# Patient Record
Sex: Female | Born: 1985 | Race: Black or African American | Hispanic: No | Marital: Married | Smoking: Current every day smoker
Health system: Southern US, Community
[De-identification: ages and names within clinical notes are randomized; demographics above are authoritative.]

## PROBLEM LIST (undated history)

## (undated) DIAGNOSIS — K2970 Gastritis, unspecified, without bleeding: Secondary | ICD-10-CM

## (undated) HISTORY — DX: Gastritis, unspecified, without bleeding: K29.70

## (undated) HISTORY — PX: BREAST SURGERY: SHX581

## (undated) HISTORY — PX: LAPAROSCOPY: SHX197

---

## 2009-11-10 DIAGNOSIS — N809 Endometriosis, unspecified: Secondary | ICD-10-CM

## 2009-11-10 HISTORY — PX: ABDOMINAL HYSTERECTOMY: SHX81

## 2009-11-10 HISTORY — DX: Endometriosis, unspecified: N80.9

## 2018-06-11 ENCOUNTER — Encounter (HOSPITAL_COMMUNITY): Payer: Self-pay | Admitting: Emergency Medicine

## 2018-06-11 ENCOUNTER — Emergency Department (HOSPITAL_COMMUNITY)
Admission: EM | Admit: 2018-06-11 | Discharge: 2018-06-11 | Disposition: A | Discharge status: Home / Self Care | Payer: Medicaid Other | Attending: Emergency Medicine | Admitting: Emergency Medicine

## 2018-06-11 DIAGNOSIS — K0889 Other specified disorders of teeth and supporting structures: Secondary | ICD-10-CM | POA: Insufficient documentation

## 2018-06-11 MED ORDER — CLINDAMYCIN HCL 150 MG PO CAPS: 300.0000 mg | ORAL_CAPSULE | Freq: Three times a day (TID) | ORAL | 0 refills | Status: DC

## 2018-06-11 NOTE — ED Provider Notes (Signed)
[facility]-EMERGENCY DEPT Provider Note   CSN: [id number] Arrival date & time: 06/11/18  0955     History   Chief Complaint Chief Complaint  Patient presents with  . Abscess    HPI Yvette Morales is a 32 y.o. female.  HPI   Patient is a 32 year old female no significant past medical history presents emergency department today for evaluation of left upper dental pain that has been ongoing intermittently for the last 4 months.  Over the last 3 to 4 days she feels like her pain has been constant and worsening.  Reports that one week ago she had fevers and drainage from the area but has had no continued fevers or drainage.  Denies any other symptoms.  States she has been trying to see a dentist but she has been able to do so.  She has tried salt water gargles at home which have improved her symptoms.  Over-the-counter medications do not help her symptoms.   History reviewed. No pertinent past medical history.  There are no active problems to display for this patient.   History reviewed. No pertinent surgical history.   OB History   None      Home Medications    Prior to Admission medications   Medication Sig Start Date End Date Taking? Authorizing Provider  clindamycin (CLEOCIN) 150 MG capsule Take 2 capsules (300 mg total) by mouth 3 (three) times daily for 10 days. 06/11/18 06/21/18  Shantel Helwig S, PA-C    Family History No family history on file.  Social History Social History   Tobacco Use  . Smoking status: Never Smoker  . Smokeless tobacco: Never Used  Substance Use Topics  . Alcohol use: Not on file  . Drug use: Not on file     Allergies   Morphine and related; Penicillins; and Codeine   Review of Systems Review of Systems  Constitutional: Negative for fever.  HENT: Positive for dental problem.   Eyes: Negative for visual disturbance.  Respiratory: Negative for shortness of breath.   Cardiovascular: Negative for chest pain.   Gastrointestinal: Negative for abdominal pain.  Genitourinary: Negative for pelvic pain.  Musculoskeletal: Negative for back pain.  Neurological: Negative for headaches.     Physical Exam Updated Vital Signs BP 134/70 (BP Location: Right Arm)   Pulse 74   Temp 98 F (36.7 C) (Oral)   Resp 18   SpO2 100%   Physical Exam  Constitutional: She is oriented to person, place, and time. She appears well-developed and well-nourished. No distress.  HENT:  Head: Normocephalic and atraumatic.  Bilateral TMs normal.  No pharyngeal erythema.  Multiple missing teeth.  No gross abscess on exam.  No tonsillar swelling or exudates.  Uvula midline.  Tolerating secretions.  Mild tenderness to the left maxillary sinus with some swelling.  No trismus on exam.  Eyes: Pupils are equal, round, and reactive to light. Conjunctivae and EOM are normal.  Neck: Neck supple.  Cardiovascular: Normal rate and regular rhythm.  Pulmonary/Chest: Effort normal and breath sounds normal.  Musculoskeletal: Normal range of motion.  Lymphadenopathy:    She has cervical adenopathy.  Neurological: She is alert and oriented to person, place, and time. No cranial nerve deficit.  Skin: Skin is warm and dry.  Psychiatric: She has a normal mood and affect.  Nursing note and vitals reviewed.  ED Treatments / Results  Labs (all labs ordered are listed, but only abnormal results are displayed) Labs Reviewed - No  data to display  EKG None  Radiology No results found.  Procedures Procedures (including critical care time)  Medications Ordered in ED Medications - No data to display   Initial Impression / Assessment and Plan / ED Course  I have reviewed the triage vital signs and the nursing notes.  Pertinent labs & imaging results that were available during my care of the patient were reviewed by me and considered in my medical decision making (see chart for details).   Final Clinical Impressions(s) / ED Diagnoses    Final diagnoses:  Pain, dental   Patient with toothache.  No gross abscess.  Exam unconcerning for Ludwig's angina or spread of infection.  Will treat with antibiotics and pain medicine.  Urged patient to follow-up with dentist.  Return precautions discussed.  Patient was in understanding of plan reasons to return.  All questions answered.  ED Discharge Orders        Ordered    clindamycin (CLEOCIN) 150 MG capsule  3 times daily     06/11/18 1115       Teagen Bucio S, PA-C 06/11/18 1207    Cathren LaineSteinl, Kevin, MD 06/15/18 [phone]

## 2018-06-11 NOTE — Discharge Instructions (Signed)
You were given a prescription for antibiotics. Please take the antibiotic prescription fully.   Please follow up with your primary care provider within 5-7 days for re-evaluation of your symptoms. If you do not have a primary care provider, information for a healthcare clinic has been provided for you to make arrangements for follow up care. Please return to the emergency department for any new or worsening symptoms.  Follow-up with a dentist in regards to your symptoms today.  Dental resources provided.

## 2018-06-11 NOTE — ED Triage Notes (Signed)
Patient here from home with complaints of dental abscess.

## 2018-06-20 DIAGNOSIS — G501 Atypical facial pain: Secondary | ICD-10-CM | POA: Diagnosis present

## 2018-06-20 DIAGNOSIS — K047 Periapical abscess without sinus: Secondary | ICD-10-CM | POA: Diagnosis not present

## 2018-06-21 ENCOUNTER — Emergency Department (HOSPITAL_COMMUNITY)
Admission: EM | Admit: 2018-06-21 | Discharge: 2018-06-21 | Disposition: A | Discharge status: Home / Self Care | Payer: Medicaid Other | Attending: Emergency Medicine | Admitting: Emergency Medicine

## 2018-06-21 ENCOUNTER — Encounter (HOSPITAL_COMMUNITY): Payer: Self-pay | Admitting: Nurse Practitioner

## 2018-06-21 DIAGNOSIS — K047 Periapical abscess without sinus: Secondary | ICD-10-CM

## 2018-06-21 MED ORDER — NAPROXEN 500 MG PO TABS
500.0000 mg | ORAL_TABLET | Freq: Once | ORAL | Status: AC
  Administered 2018-06-21: 500 mg via ORAL
  Filled 2018-06-21: qty 1

## 2018-06-21 MED ORDER — NAPROXEN 500 MG PO TABS: 500.0000 mg | ORAL_TABLET | Freq: Two times a day (BID) | ORAL | 0 refills | Status: DC

## 2018-06-21 MED ORDER — LIDOCAINE VISCOUS HCL 2 % MT SOLN
15.0000 mL | Freq: Once | OROMUCOSAL | Status: AC
  Administered 2018-06-21: 15 mL via OROMUCOSAL
  Filled 2018-06-21: qty 15

## 2018-06-21 MED ORDER — LIDOCAINE VISCOUS HCL 2 % MT SOLN: 15.0000 mL | OROMUCOSAL | 0 refills | Status: DC | PRN

## 2018-06-21 MED ORDER — CLINDAMYCIN HCL 150 MG PO CAPS: 300.0000 mg | ORAL_CAPSULE | Freq: Three times a day (TID) | ORAL | 0 refills | Status: DC

## 2018-06-21 NOTE — ED Provider Notes (Signed)
[facility]-EMERGENCY DEPT Provider Note   CSN: [id number] Arrival date & time: 06/20/18  2356     History   Chief Complaint Chief Complaint  Patient presents with  . Facial Pain    HPI Yvette Morales is a 32 y.o. female.  The history is provided by the patient and medical records.     32 year old female presenting to the ED for left-sided facial pain.  States she was seen in the ED last week for dental abscess.  States he has been taking the clindamycin as directed but pain continues worsening.  She does report she had a dental extraction a few years ago and pieces of the tooth were left embedded in her gums.  States she feels like they are "moving around".  She moved to [place] 6 months ago but has not yet been able to establish care with a dentist as she had to renew her Medicaid.  States she has been taking the clindamycin she was prescribed as well as Tylenol and Orajel without much relief.  History reviewed. No pertinent past medical history.  There are no active problems to display for this patient.   History reviewed. No pertinent surgical history.   OB History   None      Home Medications    Prior to Admission medications   Medication Sig Start Date End Date Taking? Authorizing Provider  clindamycin (CLEOCIN) 150 MG capsule Take 2 capsules (300 mg total) by mouth 3 (three) times daily for 10 days. 06/11/18 06/21/18  Couture, Cortni S, PA-C    Family History History reviewed. No pertinent family history.  Social History Social History   Tobacco Use  . Smoking status: Never Smoker  . Smokeless tobacco: Never Used  Substance Use Topics  . Alcohol use: Yes  . Drug use: Yes     Allergies   Morphine and related; Penicillins; and Codeine   Review of Systems Review of Systems  HENT: Positive for dental problem.   All other systems reviewed and are negative.    Physical Exam Updated Vital Signs BP 128/84 (BP Location: Left  Arm)   Pulse 86   Temp 98.6 F (37 C) (Oral)   Resp 16   Ht 5\' 5"  (1.651 m)   Wt 52.2 kg   SpO2 100%   BMI 19.14 kg/m   Physical Exam  Constitutional: She is oriented to person, place, and time. She appears well-developed and well-nourished.  HENT:  Head: Normocephalic and atraumatic.  Mouth/Throat: Oropharynx is clear and moist.  Teeth largely in ppor dentition, multiple teeth are missing, left lateral canine in place but appears infected; surrounding gingiva discolored and mildly swollen, handling secretions appropriately, no trismus, no facial or neck swelling, normal phonation without stridor  Eyes: Pupils are equal, round, and reactive to light. Conjunctivae and EOM are normal.  Neck: Normal range of motion.  Cardiovascular: Normal rate, regular rhythm and normal heart sounds.  Pulmonary/Chest: Effort normal and breath sounds normal. No stridor. No respiratory distress.  Abdominal: Soft. Bowel sounds are normal. There is no tenderness. There is no rebound.  Musculoskeletal: Normal range of motion.  Neurological: She is alert and oriented to person, place, and time.  Skin: Skin is warm and dry.  Psychiatric: She has a normal mood and affect.  Nursing note and vitals reviewed.    ED Treatments / Results  Labs (all labs ordered are listed, but only abnormal results are displayed) Labs Reviewed - No data to display  EKG None  Radiology No results found.  Procedures Procedures (including critical care time)  Medications Ordered in ED Medications - No data to display   Initial Impression / Assessment and Plan / ED Course  I have reviewed the triage vital signs and the nursing notes.  Pertinent labs & imaging results that were available during my care of the patient were reviewed by me and considered in my medical decision making (see chart for details).  32 year old female here with left-sided facial pain.  Seen here last week for dental abscess.  On exam she is  afebrile and nontoxic.  Does appear to have dental infection but no discrete abscess visualized on my exam.  Her main complaint is worsening pain.  She has no significant facial or neck swelling.  Normal phonation without stridor.  No signs or symptoms concerning for Ludwig's angina or deep space infection of the neck.  Will continue her clindamycin, add naprosyn and viscous lidocaine.  Will refer to dentist on call, strongly encouraged to follow-up ASAP.  Discussed plan with patient, she acknowledged understanding and agreed with plan of care.  Return precautions given for new or worsening symptoms.  Final Clinical Impressions(s) / ED Diagnoses   Final diagnoses:  Dental infection    ED Discharge Orders         Ordered    clindamycin (CLEOCIN) 150 MG capsule  3 times daily     06/21/18 0114    naproxen (NAPROSYN) 500 MG tablet  2 times daily with meals     06/21/18 0114    lidocaine (XYLOCAINE) 2 % solution  Every 2 hours PRN     06/21/18 0114           Garlon HatchetSanders, Shenell Rogalski M, PA-C 06/21/18 0123    Molpus, Jonny RuizJohn, MD 06/21/18 [phone]

## 2018-06-21 NOTE — Discharge Instructions (Signed)
Take the prescribed medication as directed. Follow-up with Dr. Michiel SitesKoelling-- call his office first thing in the morning to see if they can fit you in. Return to the ED for new or worsening symptoms.

## 2018-06-21 NOTE — ED Triage Notes (Signed)
Pt is c/o left sided severe facial pain that she describes as "something about to pop in my head, in eye or nose." She relates the pain to dental abscess that she is currently taking abx as prescribed but states the pain is unbearable ans the swelling is only worsening.

## 2018-12-05 ENCOUNTER — Emergency Department: Payer: Medicaid Other

## 2018-12-05 ENCOUNTER — Other Ambulatory Visit: Payer: Self-pay

## 2018-12-05 ENCOUNTER — Encounter: Payer: Self-pay | Admitting: Emergency Medicine

## 2018-12-05 ENCOUNTER — Emergency Department
Admission: EM | Admit: 2018-12-05 | Discharge: 2018-12-05 | Disposition: A | Discharge status: Home / Self Care | Payer: Medicaid Other | Attending: Emergency Medicine | Admitting: Emergency Medicine

## 2018-12-05 DIAGNOSIS — N3001 Acute cystitis with hematuria: Secondary | ICD-10-CM | POA: Insufficient documentation

## 2018-12-05 DIAGNOSIS — R1030 Lower abdominal pain, unspecified: Secondary | ICD-10-CM | POA: Diagnosis not present

## 2018-12-05 LAB — URINALYSIS, COMPLETE (UACMP) WITH MICROSCOPIC
Bilirubin Urine: NEGATIVE
Glucose, UA: NEGATIVE mg/dL
Ketones, ur: NEGATIVE mg/dL
Nitrite: POSITIVE — AB
PROTEIN: 30 mg/dL — AB
SPECIFIC GRAVITY, URINE: 1.016 (ref 1.005–1.030)
WBC, UA: >50 WBC/hpf — ABNORMAL HIGH (ref 0–5)
pH: 5 (ref 5.0–8.0)

## 2018-12-05 LAB — CBC WITH DIFFERENTIAL/PLATELET
Abs Immature Granulocytes: 0.02 10*3/uL (ref 0.00–0.07)
Basophils Absolute: 0 10*3/uL (ref 0.0–0.1)
Basophils Relative: 1 %
EOS PCT: 3 %
Eosinophils Absolute: 0.3 10*3/uL (ref 0.0–0.5)
HEMATOCRIT: 42.8 % (ref 36.0–46.0)
HEMOGLOBIN: 13.8 g/dL (ref 12.0–15.0)
IMMATURE GRANULOCYTES: 0 %
LYMPHS ABS: 3.1 10*3/uL (ref 0.7–4.0)
LYMPHS PCT: 35 %
MCH: 28.2 pg (ref 26.0–34.0)
MCHC: 32.2 g/dL (ref 30.0–36.0)
MCV: 87.3 fL (ref 80.0–100.0)
Monocytes Absolute: 0.7 10*3/uL (ref 0.1–1.0)
Monocytes Relative: 8 %
NEUTROS PCT: 53 %
NRBC: 0 % (ref 0.0–0.2)
Neutro Abs: 4.7 10*3/uL (ref 1.7–7.7)
Platelets: 256 10*3/uL (ref 150–400)
RBC: 4.9 MIL/uL (ref 3.87–5.11)
RDW: 13.6 % (ref 11.5–15.5)
WBC: 8.9 10*3/uL (ref 4.0–10.5)

## 2018-12-05 LAB — COMPREHENSIVE METABOLIC PANEL
ALBUMIN: 4.6 g/dL (ref 3.5–5.0)
ALK PHOS: 47 U/L (ref 38–126)
ALT: 14 U/L (ref 0–44)
AST: 16 U/L (ref 15–41)
Anion gap: 8 (ref 5–15)
BILIRUBIN TOTAL: 0.7 mg/dL (ref 0.3–1.2)
BUN: 13 mg/dL (ref 6–20)
CHLORIDE: 104 mmol/L (ref 98–111)
CO2: 27 mmol/L (ref 22–32)
Calcium: 9.7 mg/dL (ref 8.9–10.3)
Creatinine, Ser: 0.74 mg/dL (ref 0.44–1.00)
GFR calc Af Amer: >60 mL/min (ref >60)
GFR calc non Af Amer: >60 mL/min (ref >60)
GLUCOSE: 82 mg/dL (ref 70–99)
Potassium: 4.2 mmol/L (ref 3.5–5.1)
Sodium: 139 mmol/L (ref 135–145)
TOTAL PROTEIN: 7.6 g/dL (ref 6.5–8.1)

## 2018-12-05 LAB — LIPASE, BLOOD: Lipase: 26 U/L (ref 11–51)

## 2018-12-05 MED ORDER — PHENAZOPYRIDINE HCL 200 MG PO TABS: 200.0000 mg | ORAL_TABLET | Freq: Three times a day (TID) | ORAL | 0 refills | Status: DC | PRN

## 2018-12-05 MED ORDER — HYDROMORPHONE HCL 1 MG/ML IJ SOLN
0.5000 mg | Freq: Once | INTRAMUSCULAR | Status: AC
  Administered 2018-12-05: 0.5 mg via INTRAVENOUS
  Filled 2018-12-05: qty 1

## 2018-12-05 MED ORDER — SULFAMETHOXAZOLE-TRIMETHOPRIM 800-160 MG PO TABS: 1.0000 | ORAL_TABLET | Freq: Two times a day (BID) | ORAL | 0 refills | Status: DC

## 2018-12-05 MED ORDER — PHENAZOPYRIDINE HCL 200 MG PO TABS
200.0000 mg | ORAL_TABLET | Freq: Three times a day (TID) | ORAL | 0 refills | Status: AC | PRN
Start: 2018-12-05 — End: 2019-12-05

## 2018-12-05 MED ORDER — SODIUM CHLORIDE 0.9 % IV SOLN
1.0000 g | Freq: Once | INTRAVENOUS | Status: AC
  Administered 2018-12-05: 1 g via INTRAVENOUS
  Filled 2018-12-05 (×2): qty 10

## 2018-12-05 NOTE — ED Provider Notes (Signed)
[facility] Emergency Department Provider Note       Time seen: ----------------------------------------- 3:00 PM on 12/05/2018 -----------------------------------------   I have reviewed the triage vital signs and the nursing notes.  HISTORY   Chief Complaint No chief complaint on file.    HPI Yvette Morales is a 33 y.o. female with no significant past medical history who presents to the ED for abdominal pain.  Patient states severe bladder pressure for the past 4 days, feels like she is not emptying her bladder.  She stopped eating meat recently and has felt fatigued.  She has been taking Azo for the pain, also having pain in the right side.  No past medical history on file.  There are no active problems to display for this patient.   No past surgical history on file.  Allergies Morphine and related; Penicillins; and Codeine  Social History Social History   Tobacco Use  . Smoking status: Never Smoker  . Smokeless tobacco: Never Used  Substance Use Topics  . Alcohol use: Yes  . Drug use: Yes    Review of Systems Constitutional: Negative for fever. Cardiovascular: Negative for chest pain. Respiratory: Negative for shortness of breath. Gastrointestinal: Positive for abdominal pain Genitourinary: Positive for dysuria and urinary hesitancy Musculoskeletal: Positive for flank pain Skin: Negative for rash. Neurological: Negative for headaches, focal weakness or numbness.  All systems negative/normal/unremarkable except as stated in the HPI  ____________________________________________   PHYSICAL EXAM:  VITAL SIGNS: ED Triage Vitals  Enc Vitals Group     BP      Pulse      Resp      Temp      Temp src      SpO2      Weight      Height      Head Circumference      Peak Flow      Pain Score      Pain Loc      Pain Edu?      Excl. in GC?    Constitutional: Alert and oriented.  Mild distress from pain Eyes: Conjunctivae  are normal. Normal extraocular movements. ENT      Head: Normocephalic and atraumatic.      Nose: No congestion/rhinnorhea.      Mouth/Throat: Mucous membranes are moist.      Neck: No stridor. Cardiovascular: Normal rate, regular rhythm. No murmurs, rubs, or gallops. Respiratory: Normal respiratory effort without tachypnea nor retractions. Breath sounds are clear and equal bilaterally. No wheezes/rales/rhonchi. Gastrointestinal: Diffuse lower abdominal tenderness and pain.  No rebound or guarding.  Normal bowel sounds. Musculoskeletal: Nontender with normal range of motion in extremities. No lower extremity tenderness nor edema. Neurologic:  Normal speech and language. No gross focal neurologic deficits are appreciated.  Skin:  Skin is warm, dry and intact. No rash noted. Psychiatric: Mood and affect are normal. Speech and behavior are normal.  ____________________________________________  ED COURSE:  As part of my medical decision making, I reviewed the following data within the electronic MEDICAL RECORD NUMBER History obtained from family if available, nursing notes, old chart and ekg, as well as notes from prior ED visits. Patient presented for lower abdominal pain, we will assess with labs and imaging as indicated at this time.   Procedures ____________________________________________   LABS (pertinent positives/negatives)  Labs Reviewed  URINALYSIS, COMPLETE (UACMP) WITH MICROSCOPIC - Abnormal; Notable for the following components:      Result Value   Color,  Urine AMBER (*)    APPearance CLOUDY (*)    Hgb urine dipstick MODERATE (*)    Protein, ur 30 (*)    Nitrite POSITIVE (*)    Leukocytes, UA LARGE (*)    WBC, UA >50 (*)    Bacteria, UA FEW (*)    All other components within normal limits  CBC WITH DIFFERENTIAL/PLATELET  COMPREHENSIVE METABOLIC PANEL  LIPASE, BLOOD   RADIOLOGY Images were viewed by me IMPRESSION: Tiny nonobstructing renal calculi are noted  bilaterally.  Bilateral renal cysts.  No other focal abnormality is noted. ____________________________________________   DIFFERENTIAL DIAGNOSIS   UTI, ovarian cyst, ovarian torsion, renal colic, appendicitis  FINAL ASSESSMENT AND PLAN  Abdominal pain, UTI  Plan: The patient had presented for acute lower abdominal pain. Patient's labs did reveal significant urinary tract infection. Patient's imaging was negative for any acute process.  She was given fluids and Rocephin here.  She be discharged with Septra DS and Pyridium.  She is cleared for outpatient follow-up.   Ulice Dash, MD    Note: This note was generated in part or whole with voice recognition software. Voice recognition is usually quite accurate but there are transcription errors that can and very often do occur. I apologize for any typographical errors that were not detected and corrected.     Emily Filbert, MD 12/05/18 [phone]

## 2018-12-05 NOTE — ED Triage Notes (Signed)
Here for lower abdominal pain.  Pt has severe bladder pressure for 4 days.  Feels like not emptying bladder. Stopped eating meat recently. Has had fatigue since then.  Has been taking azo. Has seen blood in urine before taking azo.  Has had some right flank pain. Pain in both groin.

## 2018-12-05 NOTE — ED Notes (Signed)
First Nurse Note: Pt to ED c/o possible kidney infection. Pt in NAD

## 2019-06-30 ENCOUNTER — Ambulatory Visit: Payer: Medicaid Other | Admitting: Gastroenterology

## 2020-01-23 ENCOUNTER — Ambulatory Visit (INDEPENDENT_AMBULATORY_CARE_PROVIDER_SITE_OTHER): Payer: Medicaid Other | Admitting: Nurse Practitioner

## 2020-02-21 ENCOUNTER — Ambulatory Visit (INDEPENDENT_AMBULATORY_CARE_PROVIDER_SITE_OTHER): Payer: Medicaid Other | Admitting: Nurse Practitioner

## 2020-02-21 ENCOUNTER — Encounter (INDEPENDENT_AMBULATORY_CARE_PROVIDER_SITE_OTHER): Payer: Self-pay

## 2020-02-21 ENCOUNTER — Encounter (INDEPENDENT_AMBULATORY_CARE_PROVIDER_SITE_OTHER): Payer: Self-pay | Admitting: Nurse Practitioner

## 2020-02-21 ENCOUNTER — Other Ambulatory Visit: Payer: Self-pay

## 2020-02-21 VITALS — BP 120/75 | HR 84 | Temp 98.3°F | Ht 65.0 in | Wt 113.8 lb

## 2020-02-21 DIAGNOSIS — Z1329 Encounter for screening for other suspected endocrine disorder: Secondary | ICD-10-CM | POA: Diagnosis not present

## 2020-02-21 DIAGNOSIS — Z139 Encounter for screening, unspecified: Secondary | ICD-10-CM

## 2020-02-21 DIAGNOSIS — R634 Abnormal weight loss: Secondary | ICD-10-CM

## 2020-02-21 DIAGNOSIS — Z13220 Encounter for screening for lipoid disorders: Secondary | ICD-10-CM | POA: Diagnosis not present

## 2020-02-21 DIAGNOSIS — R6889 Other general symptoms and signs: Secondary | ICD-10-CM

## 2020-02-21 DIAGNOSIS — R5383 Other fatigue: Secondary | ICD-10-CM

## 2020-02-21 DIAGNOSIS — R1030 Lower abdominal pain, unspecified: Secondary | ICD-10-CM | POA: Diagnosis not present

## 2020-02-21 DIAGNOSIS — Z131 Encounter for screening for diabetes mellitus: Secondary | ICD-10-CM | POA: Diagnosis not present

## 2020-02-21 NOTE — Progress Notes (Signed)
Subjective:  Patient ID: Yvette Morales, female    DOB: 12-26-1985  Age: 34 y.o. MRN: [id number]  CC:  Chief Complaint  Patient presents with  . Establish Care  . Abdominal Pain    due to reaction to hydrocodine 5/325 4 yrs ago       HPI  This patient arrives today to establish care at our practice.  Her main concern is that she has been experiencing constant abdominal pain, and she would like work-up for this.  She tells me approximately 5 years ago she had surgery on her breast and was prescribed hydrocodone to take as needed during her recovery.  At that time she experienced an allergic reaction and describes to me blisters that formed on her mouth as well as her rash.  Ever since then she was having intermittent pain of her abdomen.  Over the last 5 years the pain has gotten progressively worse, and she now tells me that it is constant.  She has been evaluated for this and is often told that she has gastritis.  She tells me she has never undergone any further diagnostic testing.  She does me this is mostly been because she is been afraid to undergo procedures and has been caring for her mother who recently passed from liver failure.  She is seeking care now, because she feels she is at a point in her life that she can focus on her own health.  She tells me she did have an episode of rectal bleeding a few weeks ago, but this has not returned.  She also tells me that her stool appears to be light in color and is described as loose.  She tells me she has bowel urgency, and most foods or liquids seem to upset her stomach.  Mangoes and Ensure shakes are things that she can tolerate.  She tells me she has been having some weight loss, but recently has noticed her weight is starting to increase again.   Past Medical History:  Diagnosis Date  . Endometriosis 11/10/2009  . Gastritis       Family History  Problem Relation Age of Onset  . Liver disease Mother   . Cancer Father      Social History   Social History Narrative  . Not on file   Social History   Tobacco Use  . Smoking status: Former Smoker    Types: Cigarettes  . Smokeless tobacco: Former Network engineer Use Topics  . Alcohol use: Not Currently     Current Meds  Medication Sig  . ibuprofen (ADVIL) 200 MG tablet Take 400 mg by mouth every 6 (six) hours as needed.    ROS:  Review of Systems  Constitutional: Positive for malaise/fatigue. Negative for fever and weight loss.  Respiratory: Negative for shortness of breath.   Cardiovascular: Negative for chest pain and palpitations.  Gastrointestinal: Positive for abdominal pain, blood in stool, diarrhea, nausea and vomiting.  Musculoskeletal: Positive for neck pain.  Neurological: Negative for dizziness and headaches.     Objective:   Today's Vitals: BP 120/75 (BP Location: Left Arm, Patient Position: Sitting, Cuff Size: Normal)   Pulse 84   Temp 98.3 F (36.8 C) (Temporal)   Ht '5\' 5"'  (1.651 m)   Wt 113 lb 12.8 oz (51.6 kg)   SpO2 93%   BMI 18.94 kg/m  Vitals with BMI 02/21/2020 12/05/2018 12/05/2018  Height '5\' 5"'  - -  Weight 113 lbs 13 oz - -  BMI 30.94 - -  Systolic 076 808 811  Diastolic 75 71 94  Pulse 84 84 94     Physical Exam Vitals reviewed.  Constitutional:      General: She is not in acute distress.    Appearance: Normal appearance.  HENT:     Head: Normocephalic and atraumatic.  Cardiovascular:     Rate and Rhythm: Normal rate and regular rhythm.     Pulses: Normal pulses.     Heart sounds: Normal heart sounds.  Pulmonary:     Effort: Pulmonary effort is normal.     Breath sounds: Normal breath sounds.  Abdominal:     General: Abdomen is flat. A surgical scar is present. Bowel sounds are normal. There is no abdominal bruit.     Palpations: Abdomen is soft. There is no hepatomegaly, splenomegaly or mass.     Tenderness: There is abdominal tenderness in the right lower quadrant and left lower quadrant. There  is right CVA tenderness.     Hernia: No hernia is present.  Skin:    General: Skin is warm and dry.  Neurological:     General: No focal deficit present.     Mental Status: She is alert and oriented to person, place, and time.  Psychiatric:        Mood and Affect: Mood normal.        Behavior: Behavior normal.        Judgment: Judgment normal.     Comments: Tearful at times          Assessment and Plan   1. Lower abdominal pain   2. Fatigue, unspecified type   3. Screening for condition   4. Weight loss   5. Exercise intolerance   6. Diabetes mellitus screening   7. Lipid screening   8. Thyroid disorder screen      Plan: 1., 4.   At this point I recommended that she be seen by specialist for further evaluation.  I will refer her to gastroenterology to discuss next steps regarding diagnostic procedures.  It sounds like her weight is starting to stable its which is reassuring, however her BMI is still fairly low.  She was encouraged to continue drinking her Ensure shakes and eat what she can tolerate.  2.-3.,  6.-8.  I will collect blood work for initial evaluation. Further recommendations may be made based upon her blood work results.   Tests ordered Orders Placed This Encounter  Procedures  . CBC  . CMP with eGFR([facility])  . Lipid Panel  . Hemoglobin A1c  . TSH  . T3, Free  . T4, Free  . Vitamin D, 25-hydroxy  . Urinalysis with Reflex Microscopic  . Ambulatory referral to Gastroenterology      No orders of the defined types were placed in this encounter.   Patient to follow-up in 1 month with Dr. Anastasio Champion, or sooner as needed.  The patient was encouraged to call the office if she does not hear from the gastroenterologist office regarding setting up appointment within the next 2 weeks. I spent 39 minutes dedicated to the care of this patient on the date of this encounter which includes a combination of either face-to-face or virtual contact with the patient,  review of records , and ordering of tests and/or procedures.   Ailene Ards, NP

## 2020-02-22 ENCOUNTER — Encounter: Payer: Self-pay | Admitting: Internal Medicine

## 2020-02-22 LAB — URINALYSIS, ROUTINE W REFLEX MICROSCOPIC
Bilirubin Urine: NEGATIVE
Glucose, UA: NEGATIVE
Hgb urine dipstick: NEGATIVE
Ketones, ur: NEGATIVE
Leukocytes,Ua: NEGATIVE
Nitrite: NEGATIVE
Protein, ur: NEGATIVE
Specific Gravity, Urine: 1.009 (ref 1.001–1.03)
pH: 8 (ref 5.0–8.0)

## 2020-02-22 LAB — CBC
HCT: 41.2 % (ref 35.0–45.0)
Hemoglobin: 13.2 g/dL (ref 11.7–15.5)
MCH: 28.7 pg (ref 27.0–33.0)
MCHC: 32 g/dL (ref 32.0–36.0)
MCV: 89.6 fL (ref 80.0–100.0)
MPV: 10.8 fL (ref 7.5–12.5)
Platelets: 271 10*3/uL (ref 140–400)
RBC: 4.6 10*6/uL (ref 3.80–5.10)
RDW: 12.1 % (ref 11.0–15.0)
WBC: 6.9 10*3/uL (ref 3.8–10.8)

## 2020-02-22 LAB — LIPID PANEL
Cholesterol: 165 mg/dL (ref <200)
HDL: 58 mg/dL (ref >=50)
LDL Cholesterol (Calc): 93 mg/dL (calc)
Non-HDL Cholesterol (Calc): 107 mg/dL (calc) (ref <130)
Total CHOL/HDL Ratio: 2.8 (calc) (ref <5.0)
Triglycerides: 58 mg/dL (ref <150)

## 2020-02-22 LAB — COMPLETE METABOLIC PANEL WITH GFR
AG Ratio: 1.8 (calc) (ref 1.0–2.5)
ALT: 24 U/L (ref 6–29)
AST: 26 U/L (ref 10–30)
Albumin: 4.6 g/dL (ref 3.6–5.1)
Alkaline phosphatase (APISO): 47 U/L (ref 31–125)
BUN: 12 mg/dL (ref 7–25)
CO2: 28 mmol/L (ref 20–32)
Calcium: 9.8 mg/dL (ref 8.6–10.2)
Chloride: 104 mmol/L (ref 98–110)
Creat: 0.81 mg/dL (ref 0.50–1.10)
GFR, Est African American: 111 mL/min/{1.73_m2} (ref >=60)
GFR, Est Non African American: 95 mL/min/{1.73_m2} (ref >=60)
Globulin: 2.6 g/dL (calc) (ref 1.9–3.7)
Glucose, Bld: 85 mg/dL (ref 65–99)
Potassium: 4.3 mmol/L (ref 3.5–5.3)
Sodium: 138 mmol/L (ref 135–146)
Total Bilirubin: 0.5 mg/dL (ref 0.2–1.2)
Total Protein: 7.2 g/dL (ref 6.1–8.1)

## 2020-02-22 LAB — HEMOGLOBIN A1C
Hgb A1c MFr Bld: 4.7 % of total Hgb (ref <5.7)
Mean Plasma Glucose: 88 (calc)
eAG (mmol/L): 4.9 (calc)

## 2020-02-22 LAB — T3, FREE: T3, Free: 3.1 pg/mL (ref 2.3–4.2)

## 2020-02-22 LAB — TSH: TSH: 1.11 mIU/L

## 2020-02-22 LAB — T4, FREE: Free T4: 1 ng/dL (ref 0.8–1.8)

## 2020-02-22 LAB — VITAMIN D 25 HYDROXY (VIT D DEFICIENCY, FRACTURES): Vit D, 25-Hydroxy: 26 ng/mL — ABNORMAL LOW (ref 30–100)

## 2020-03-06 ENCOUNTER — Other Ambulatory Visit: Payer: Self-pay

## 2020-03-06 ENCOUNTER — Encounter (HOSPITAL_COMMUNITY): Payer: Self-pay | Admitting: *Deleted

## 2020-03-06 ENCOUNTER — Emergency Department (HOSPITAL_COMMUNITY)
Admission: EM | Admit: 2020-03-06 | Discharge: 2020-03-06 | Disposition: A | Discharge status: Home / Self Care | Payer: Medicaid Other | Attending: Emergency Medicine | Admitting: Emergency Medicine

## 2020-03-06 DIAGNOSIS — H6691 Otitis media, unspecified, right ear: Secondary | ICD-10-CM | POA: Insufficient documentation

## 2020-03-06 DIAGNOSIS — Z87891 Personal history of nicotine dependence: Secondary | ICD-10-CM | POA: Diagnosis not present

## 2020-03-06 DIAGNOSIS — K0889 Other specified disorders of teeth and supporting structures: Secondary | ICD-10-CM | POA: Diagnosis not present

## 2020-03-06 DIAGNOSIS — F1210 Cannabis abuse, uncomplicated: Secondary | ICD-10-CM | POA: Diagnosis not present

## 2020-03-06 DIAGNOSIS — Z79899 Other long term (current) drug therapy: Secondary | ICD-10-CM | POA: Diagnosis not present

## 2020-03-06 DIAGNOSIS — H6690 Otitis media, unspecified, unspecified ear: Secondary | ICD-10-CM

## 2020-03-06 DIAGNOSIS — H9201 Otalgia, right ear: Secondary | ICD-10-CM | POA: Diagnosis present

## 2020-03-06 MED ORDER — CHLORHEXIDINE GLUCONATE 0.12 % MT SOLN: 15.0000 mL | Freq: Two times a day (BID) | OROMUCOSAL | 0 refills | Status: DC

## 2020-03-06 MED ORDER — AMOXICILLIN-POT CLAVULANATE 875-125 MG PO TABS: 1.0000 | ORAL_TABLET | Freq: Two times a day (BID) | ORAL | 0 refills | Status: DC

## 2020-03-06 NOTE — ED Provider Notes (Signed)
[facility] EMERGENCY DEPARTMENT Provider Note   CSN: [id number] Arrival date & time: 03/06/20  1346     History Chief Complaint  Patient presents with  . Otalgia    Yvette Morales is a 34 y.o. female resents to emergency department today with chief complaint of rapidly worsening right ear pain x4 days.  She describes her otalgia as feeling like a fullness in her right ear.  She would try itching her ear without any symptom relief.  She noticed x2 days ago that the fullness in her right ear was now radiating pain to the right side of her jaw.  She describes the pain as a dull aching sensation.  She states her jaw pain is intermittent.  She rates the pain 6 out of 10 in severity.  She has been taking ibuprofen for pain and her last dose was approximately 2 hours prior to arrival.  She does endorse chills but has not checked her temperature at home. Denies fever, voice change, inability to control secretions, nausea/vomiting, facial swelling, dysphagia, odynophagia, drainage or trauma. She has not seen dentist in x 3 years.  History provided by patient with additional history obtained from chart review.      Past Medical History:  Diagnosis Date  . Endometriosis 11/10/2009  . Gastritis     Patient Active Problem List   Diagnosis Date Noted  . Lower abdominal pain 02/21/2020  . Fatigue 02/21/2020    Past Surgical History:  Procedure Laterality Date  . ABDOMINAL HYSTERECTOMY    . BREAST SURGERY    . LAPAROSCOPY       OB History   No obstetric history on file.     Family History  Problem Relation Age of Onset  . Liver disease Mother   . Cancer Father     Social History   Tobacco Use  . Smoking status: Former Smoker    Types: Cigarettes  . Smokeless tobacco: Former Engineer, water Use Topics  . Alcohol use: Not Currently  . Drug use: Yes    Types: Marijuana    Home Medications Prior to Admission medications   Medication Sig Start Date End Date Taking?  Authorizing Provider  ibuprofen (ADVIL) 200 MG tablet Take 400 mg by mouth every 6 (six) hours as needed.   Yes [provider]  amoxicillin-clavulanate (AUGMENTIN) 875-125 MG tablet Take 1 tablet by mouth every 12 (twelve) hours. 03/06/20   Crews Mccollam E, PA-C  chlorhexidine (PERIDEX) 0.12 % solution Use as directed 15 mLs in the mouth or throat 2 (two) times daily. 03/06/20   Karlos Scadden E, PA-C    Allergies    Codeine, Hydrocodone-acetaminophen, Latex, Penicillins, Sulfa antibiotics, Morphine and related, and Morphine  Review of Systems   Review of Systems All other systems are reviewed and are negative for acute change except as noted in the HPI.  Physical Exam Updated Vital Signs BP 104/86 (BP Location: Right Arm)   Pulse 85   Temp 98.5 F (36.9 C) (Oral)   Resp 12   Ht 5\' 5"  (1.651 m)   Wt 51.3 kg   SpO2 100%   BMI 18.80 kg/m   Physical Exam Vitals and nursing note reviewed.  Constitutional:      General: She is not in acute distress.    Appearance: She is not toxic-appearing.  HENT:     Head: Normocephalic and atraumatic.     Right Ear: External ear normal. No mastoid tenderness. Tympanic membrane is erythematous.  Left Ear: Tympanic membrane, ear canal and external ear normal.     Nose: Nose normal.     Mouth/Throat:      Comments: Wearing upper dentures.  She has fractured tooth as depicted in image above.No noted area of swelling or fluctuance. No trismus. Mouth opening to at least 3 finger widths. Handles oral secretions without difficulty. External exam shows no asymmetry of the jaw line or face, no signs of obvious swelling or infection. No swelling or tenderness to the submental or submandibular regions. No swelling or tenderness into the soft tissues of the neck.Full active range of motion of the jaw. Neck is supple with full active range of motion, no tenderness to palpation of the soft tissues.  Eyes:     General: No scleral  icterus.       Right eye: No discharge.        Left eye: No discharge.     Conjunctiva/sclera: Conjunctivae normal.  Neck:     Comments: Full ROM intact without spinous process TTP. No bony stepoffs or deformities, no paraspinous muscle TTP or muscle spasms. No rigidity or meningeal signs. No bruising, erythema, or swelling.  Cardiovascular:     Rate and Rhythm: Normal rate and regular rhythm.     Pulses: Normal pulses.     Heart sounds: Normal heart sounds.  Pulmonary:     Effort: Pulmonary effort is normal.     Breath sounds: Normal breath sounds.  Abdominal:     General: There is no distension.  Musculoskeletal:        General: Normal range of motion.     Cervical back: Normal range of motion. No muscular tenderness.  Skin:    General: Skin is warm and dry.  Neurological:     Mental Status: She is oriented to person, place, and time.  Psychiatric:        Behavior: Behavior normal.       ED Results / Procedures / Treatments   Labs (all labs ordered are listed, but only abnormal results are displayed) Labs Reviewed - No data to display  EKG None  Radiology No results found.  Procedures Procedures (including critical care time)  Medications Ordered in ED Medications - No data to display  ED Course  I have reviewed the triage vital signs and the nursing notes.  Pertinent labs & imaging results that were available during my care of the patient were reviewed by me and considered in my medical decision making (see chart for details).    MDM Rules/Calculators/A&P                      Pt is well appearing and is presenting with dental pain. Differential Diagnosis includes but is not limited to: toothache, abscess, periapical abscess, dental caries, cracked tooth, maxillary sinusitis, gingivitis, gum hyperplasia, tooth fracture, tooth dislocation. On exam patient has chronic dental decay. Patient with toothache.  No gross abscess.  Also has exam findings to suggest  right-sided otitis media.  Exam unconcerning for Ludwig's angina or spread of infection.  Will treat with Augmentin to cover for otitis media and recommend she continue anti-inflammatories medicine as well as peridex.  Patient has allergy to penicillin.  I asked her about this and she reports she had a yeast infection after taking penicillin during her pregnancy.  She describes the hives as vaginal itching.  She had no airway compromise or difficulty breathing.  Engaged in shared decision making with patient regarding antibiotic  Augmentin to treat her symptoms today.  She was agreeable to this antibiotic.  I did advise her to use over-the-counter Monistat if she does develop a yeast infection.  Urged patient to follow-up with dentist and given dental resource list. VSS.  Pt appears stable for d/c. Strict return precautions discussed.    Portions of this note were generated with Scientist, clinical (histocompatibility and immunogenetics). Dictation errors may occur despite best attempts at proofreading.  Final Clinical Impression(s) / ED Diagnoses Final diagnoses:  Pain, dental  Acute otitis media, unspecified otitis media type    Rx / DC Orders ED Discharge Orders         Ordered    amoxicillin-clavulanate (AUGMENTIN) 875-125 MG tablet  Every 12 hours     03/06/20 1511    chlorhexidine (PERIDEX) 0.12 % solution  2 times daily     03/06/20 1511           Syed Zukas, Mliss Sax 03/06/20 1515    Blane Ohara, MD 03/06/20 [phone]

## 2020-03-06 NOTE — ED Triage Notes (Signed)
Pt with right ear itching for past 2 days, recently pain to right jaw that radiates to neck on right side.  Pt admits to having a bad tooth on same side as well. Pt recently moved here and has no dentist in area yet.

## 2020-03-06 NOTE — Discharge Instructions (Addendum)
Today you were seen for dental pain.  If you start to experience and new or worsening symptoms return to the emergency department. If you start to experience fever, chills, neck stiffness/pain, or inability to move your neck or open your mouth come back to the emergency department immediately. If you begin to experience any blistering, rashes, swelling, or difficulty breathing seek medical care for evaluation of potentially more serious side effects.  Medications:  -I have prescribed you an antibiotic  to treat the infection and recommend ibuprofen which is an anti-inflammatory medicine to treat the pain.  -Swish and spit 15 mL of chlorhexidine mouthwash for 30 seconds times daily.  You can also gargle warm salt water up to 5 times daily.  Both of these rinses can help to remove bacteria from the mouth.  You can also use viscous lidocaine every 3 hours to help numb the area.  You can apply a cool compress for 15 to 20 minutes, but avoid applying heat to the area. -Continue usual home medications.   -If you get a yeast infection from the antibiotic you can take Monistat 5-day.  This is over-the-counter.  Please do not buy the Monistat 1 day as we discussed. -If you have any hives or itching you can take Benadryl. 2. Follow Up:  Please follow up with on call dentist Dr. Link Snuffer. If you are unable to schedule follow up appointment please use resource guide below.   Use the resource guide listed below to help you find a dentist if you do not already have one to follow up with. It is very important that you get evaluated by a dentist as soon as possible. Call tomorrow to schedule an appointment. Read the instructions below.     Be sure to eat something when taking the Naproxen as it can cause stomach upset and at worst stomach bleeding. Do not take additional non steroidal anti-inflammatory medicines such as naproxen, Aleve, Advil, Mobic, Diclofenac, or goodie powder while taking ibuprofen. You may  supplement with Tylenol.    Dental Care: Organization         Address  Phone  Notes  [facility] Department of [facility] [place] [phone] Accepts children up to age 23 who are enrolled in [place] or [place]; pregnant women with a Medicaid card; and children who have applied for Medicaid or [place] Health Choice, but were declined, whose parents can pay a reduced fee at time of service.  [facility] Department of [facility]  [address], [place] [phone] Accepts children up to age 61 who are enrolled in [place] or [place]; pregnant women with a Medicaid card; and children who have applied for Medicaid or [place] Health Choice, but were declined, whose parents can pay a reduced fee at time of service.  [place] [facility]  [place] [phone] Patients are seen by appointment only. Walk-ins are not accepted. [place] will see patients 19 years of age and older. Monday - Tuesday (8am-5pm) Most Wednesdays (8:30-5pm) $30 per visit, cash only  [facility] Adult Dental Access PROGRAM  [address], [facility] [phone] Patients are seen by appointment only. Walk-ins are not accepted. [place] will see patients 17 years of age and older. One Wednesday Evening (Monthly: Volunteer Based).  $30 per visit, cash only  [place]  [phone] for adults; Children  under age 40, call Graduate Pediatric Dentistry at [phone]. Children aged 85-14, please call [phone] to request a pediatric application.  Dental services are provided in all areas of dental care including fillings, crowns and bridges, complete and partial dentures, implants, gum treatment, root canals, and extractions. Preventive care is also provided. Treatment is provided to both adults and children. Patients are selected via a lottery and there  is often a waiting list.   [facility] [address], [place]  [phone] [email or web address]   [facility] [address] [place], [place] [phone], Ext. [phone] Second and Fourth Thursday of each month, opens at 6:30 AM; Clinic ends at 9 AM.  Patients are seen on a first-come first-served basis, and a limited number are seen during each clinic.   [facility]  [address] [place], [place] [phone]   Eligibility Requirements You must have lived in [place], [place], or [place] counties for at least the last three months.   You cannot be eligible for state or federal sponsored [place], including [employer], [place], or [employer].   You generally cannot be eligible for healthcare insurance through your employer.    How to apply: Eligibility screenings are held every Tuesday and Wednesday afternoon from 1:00 pm until 4:00 pm. You do not need an appointment for the interview!  [facility] [address] [place], [place] [phone]   [facility]  [phone]   [facility] Health Department  [phone]   [facility] Health Department  [phone]    RESOURCE GUIDE:  Dental Problems  Patients with Medicaid: [facility] [phone] [address]                                [phone] [address] [employer] [address] Phone:  [phone]                                                  Phone:  [phone]  If unable to pay or uninsured, contact:  [facility] or [facility]. to become qualified for the adult dental clinic.  Insufficient Money for Medicine Contact United Way:  call "211" or [facility] [phone].  No Primary Care Doctor Call [facility]  [phone] Other agencies that provide inexpensive medical care    [facility]  [phone]    [facility]  [phone]     [facility]  [phone]    [facility]  [phone]    [facility]  [phone]    [facility]  [phone]

## 2020-03-13 ENCOUNTER — Ambulatory Visit: Payer: Medicaid Other | Admitting: Gastroenterology

## 2020-03-13 ENCOUNTER — Encounter: Payer: Self-pay | Admitting: Internal Medicine

## 2020-03-13 ENCOUNTER — Telehealth: Payer: Self-pay | Admitting: Internal Medicine

## 2020-03-13 IMAGING — CT CT RENAL STONE PROTOCOL
2 of 4 series · 16 of 46 positions shown, 18 images · non-contrast
Comparison: None.

CLINICAL DATA: Lower abdominal pain for several days

EXAM:
CT ABDOMEN AND PELVIS WITHOUT CONTRAST
TECHNIQUE: Multidetector CT imaging of the abdomen and pelvis was performed
following the standard protocol without IV contrast.

[Series 2: stone full standard · axial · 0.59mm/px · z∈[-430,-65]mm · 13 of 81 slices shown, 15 images]
[im 4/81  soft-tissue]
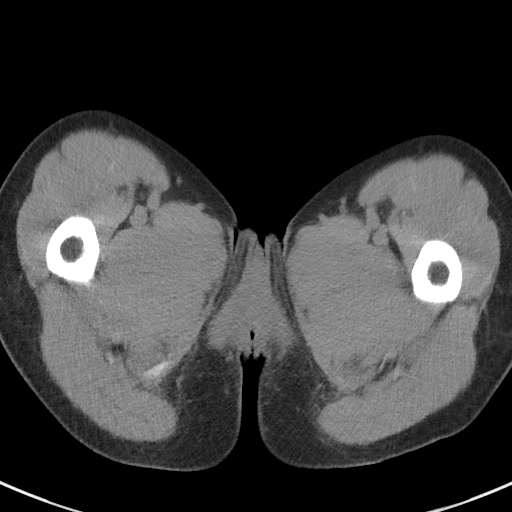
[im 4/81  bone]
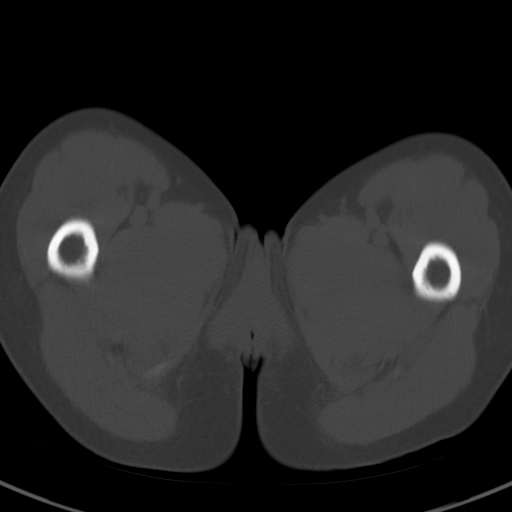
[im 10/81  soft-tissue]
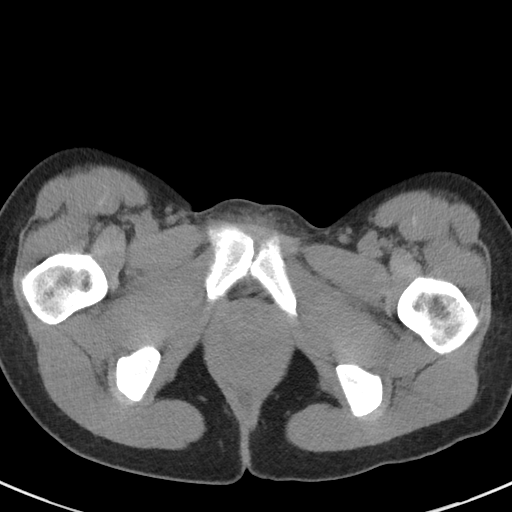
[im 16/81  soft-tissue]
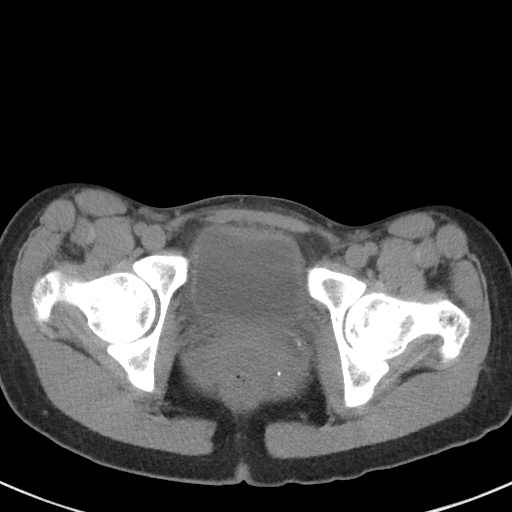
[im 22/81  soft-tissue]
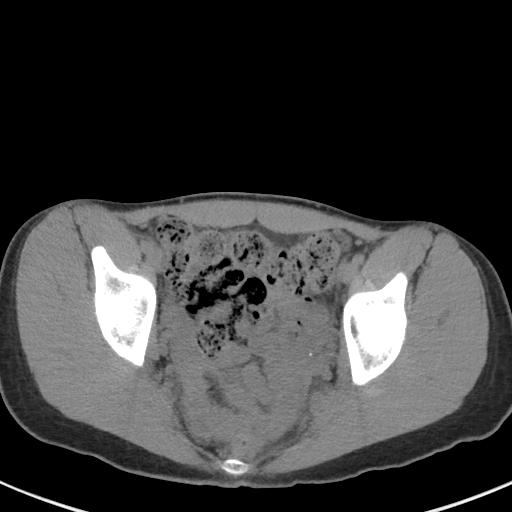
[im 28/81  soft-tissue]
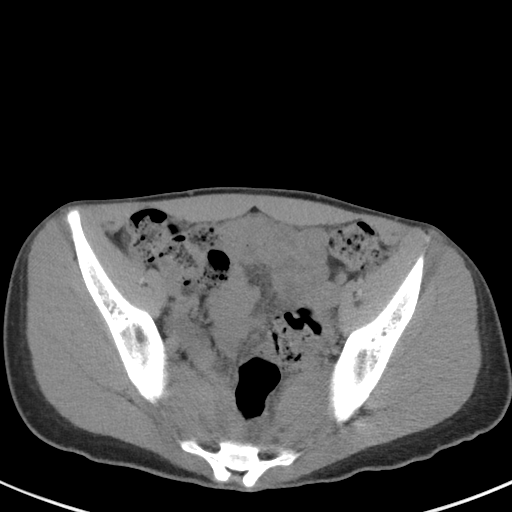
[im 34/81  soft-tissue]
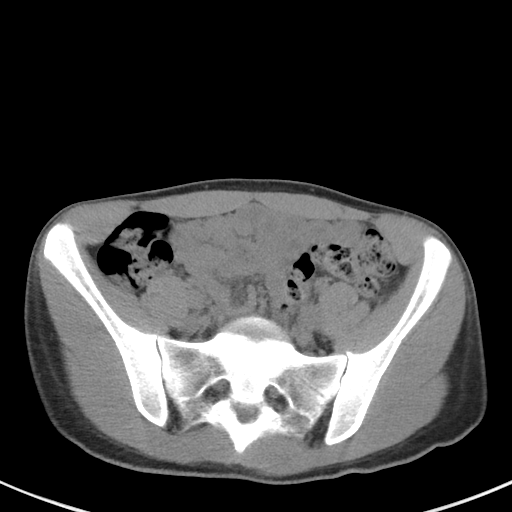
[im 41/81  soft-tissue]
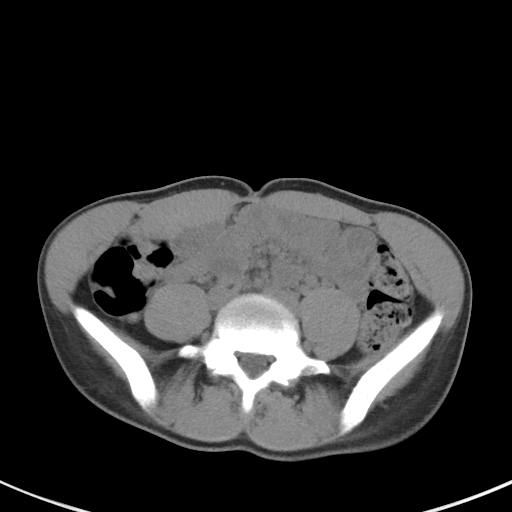
[im 47/81  soft-tissue]
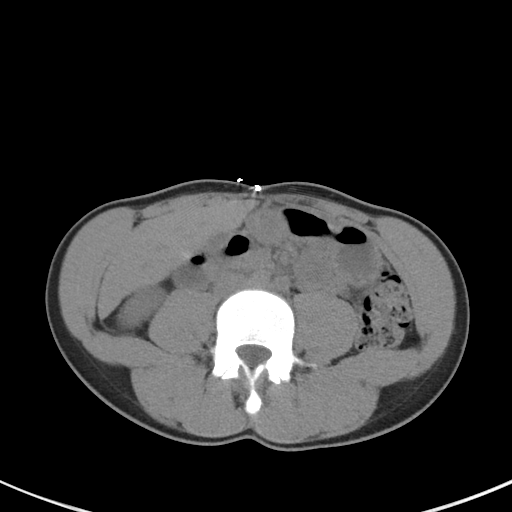
[im 53/81  soft-tissue]
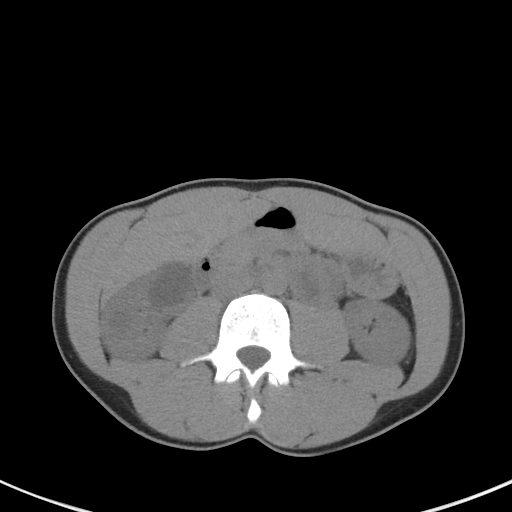
[im 53/81  bone]
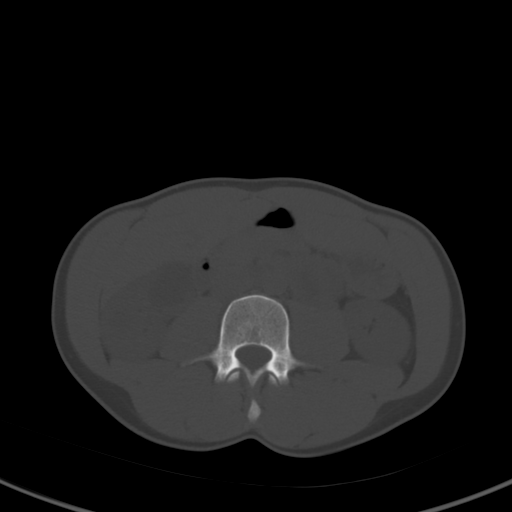
[im 59/81  soft-tissue]
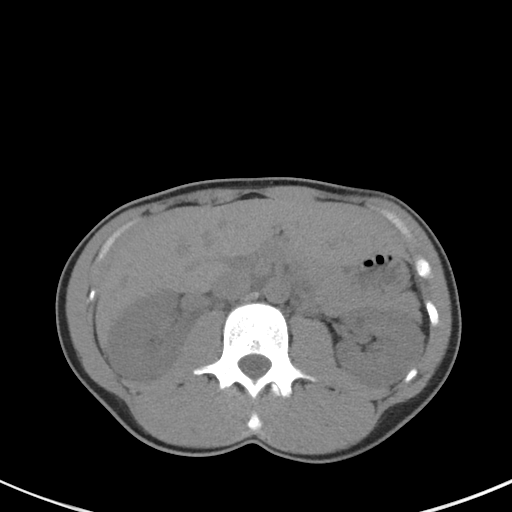
[im 65/81  soft-tissue]
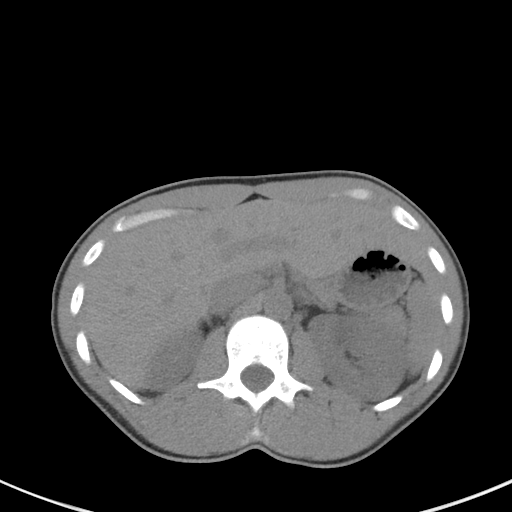
[im 71/81  soft-tissue]
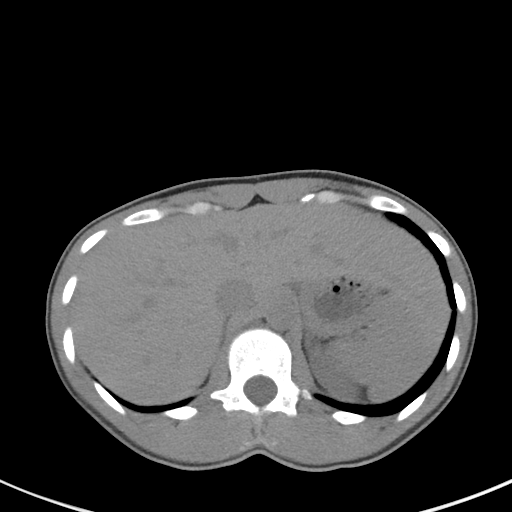
[im 77/81  soft-tissue]
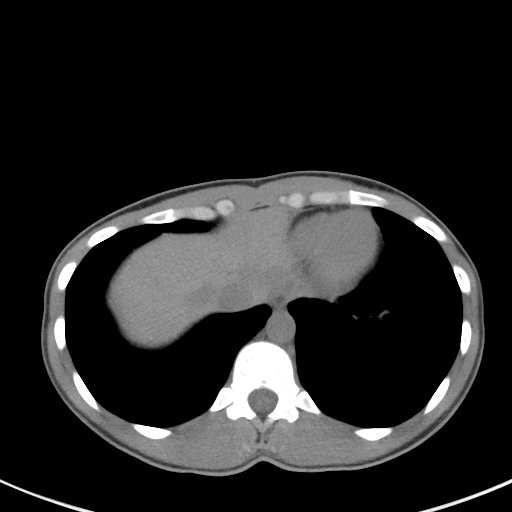

[Series 5: coronal · coronal · 0.64mm/px · 3 of 96 slices shown]
[im 32/96  soft-tissue]
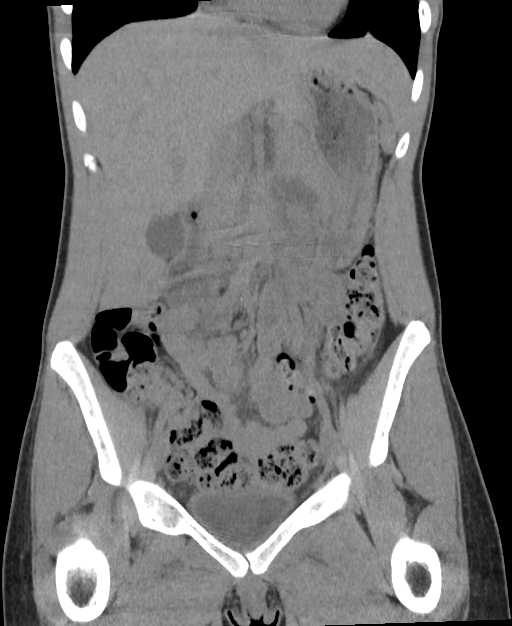
[im 43/96  soft-tissue]
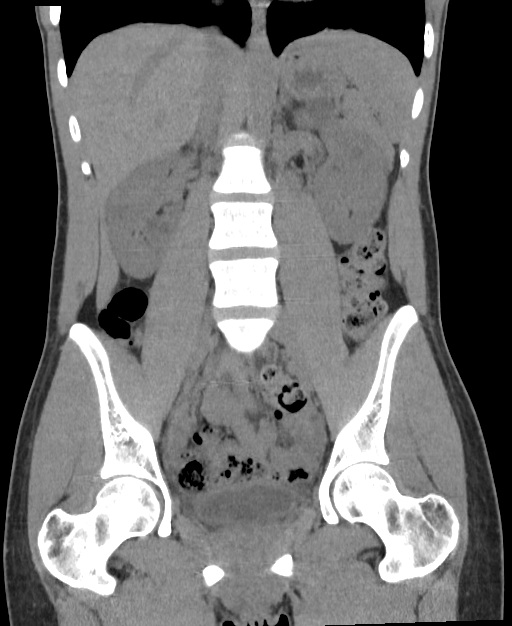
[im 53/96  soft-tissue]
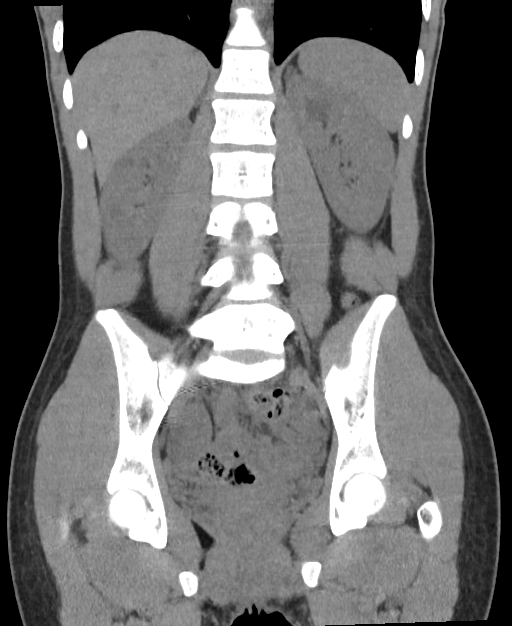

[16 of 46 positions shown; findings below may reference images not displayed]

FINDINGS: Lower chest: No acute abnormality.

Hepatobiliary: No focal liver abnormality is seen. No gallstones,
gallbladder wall thickening, or biliary dilatation.

Pancreas: Unremarkable. No pancreatic ductal dilatation or
surrounding inflammatory changes.

Spleen: Normal in size without focal abnormality.

Adrenals/Urinary Tract: Adrenal glands are within normal limits.
Kidneys are well visualized bilaterally with bilateral cysts.
Scattered tiny nonobstructing stones are identified bilaterally.
Bladder is partially distended. No ureteral stones are noted.

Stomach/Bowel: The appendix is not well seen although no
inflammatory changes to suggest appendicitis are noted. Large and
small bowel are within normal limits as is the stomach.

Vascular/Lymphatic: No significant vascular findings are present. No
enlarged abdominal or pelvic lymph nodes.

Reproductive: Uterus and bilateral adnexa are unremarkable.

Other: No abdominal wall hernia or abnormality. No abdominopelvic
ascites.

Musculoskeletal: No acute or significant osseous findings.
IMPRESSION: Tiny nonobstructing renal calculi are noted bilaterally.

Bilateral renal cysts.

No other focal abnormality is noted.

## 2020-03-13 NOTE — Telephone Encounter (Signed)
Patient was a no show and letter sent  °

## 2020-03-27 ENCOUNTER — Ambulatory Visit (INDEPENDENT_AMBULATORY_CARE_PROVIDER_SITE_OTHER): Payer: Medicaid Other | Admitting: Internal Medicine

## 2020-03-27 ENCOUNTER — Other Ambulatory Visit: Payer: Self-pay

## 2020-03-27 ENCOUNTER — Encounter (INDEPENDENT_AMBULATORY_CARE_PROVIDER_SITE_OTHER): Payer: Self-pay | Admitting: Internal Medicine

## 2020-03-27 VITALS — BP 120/80 | HR 103 | Temp 97.9°F | Ht 65.0 in | Wt 106.8 lb

## 2020-03-27 DIAGNOSIS — R634 Abnormal weight loss: Secondary | ICD-10-CM | POA: Diagnosis not present

## 2020-03-27 DIAGNOSIS — Z124 Encounter for screening for malignant neoplasm of cervix: Secondary | ICD-10-CM

## 2020-03-27 DIAGNOSIS — E559 Vitamin D deficiency, unspecified: Secondary | ICD-10-CM

## 2020-03-27 DIAGNOSIS — R1030 Lower abdominal pain, unspecified: Secondary | ICD-10-CM | POA: Diagnosis not present

## 2020-03-27 LAB — COMPLETE METABOLIC PANEL WITH GFR
AG Ratio: 1.9 (calc) (ref 1.0–2.5)
ALT: 12 U/L (ref 6–29)
AST: 17 U/L (ref 10–30)
Albumin: 4.7 g/dL (ref 3.6–5.1)
Alkaline phosphatase (APISO): 46 U/L (ref 31–125)
BUN: 11 mg/dL (ref 7–25)
CO2: 25 mmol/L (ref 20–32)
Calcium: 9.7 mg/dL (ref 8.6–10.2)
Chloride: 105 mmol/L (ref 98–110)
Creat: 0.98 mg/dL (ref 0.50–1.10)
GFR, Est African American: 88 mL/min/{1.73_m2} (ref >=60)
GFR, Est Non African American: 76 mL/min/{1.73_m2} (ref >=60)
Globulin: 2.5 g/dL (calc) (ref 1.9–3.7)
Glucose, Bld: 89 mg/dL (ref 65–99)
Potassium: 4.8 mmol/L (ref 3.5–5.3)
Sodium: 137 mmol/L (ref 135–146)
Total Bilirubin: 0.7 mg/dL (ref 0.2–1.2)
Total Protein: 7.2 g/dL (ref 6.1–8.1)

## 2020-03-27 LAB — VITAMIN D 25 HYDROXY (VIT D DEFICIENCY, FRACTURES): Vit D, 25-Hydroxy: 34 ng/mL (ref 30–100)

## 2020-03-27 LAB — SEDIMENTATION RATE: Sed Rate: 2 mm/h (ref 0–20)

## 2020-03-27 NOTE — Progress Notes (Signed)
Metrics: Intervention Frequency ACO  Documented Smoking Status Yearly  Screened one or more times in 24 months  Cessation Counseling or  Active cessation medication Past 24 months  Past 24 months   Guideline developer: UpToDate (See UpToDate for funding source) Date Released: 2014       Wellness Office Visit  Subjective:  Patient ID: Yvette Morales, female    DOB: 1986-05-21  Age: 34 y.o. MRN: [id number]  CC: This lady comes in for follow-up to discuss blood work and ongoing right-sided abdominal pain. HPI  She has an appointment to see the gastroenterologist on May 4 apparently she was a no-show on this appointment and she has been given another appointment on May 25. She still has intermittent abdominal pain which is mostly on the right side in the mid and lower abdomen. I see that she had a CT renal study done in 2020 which shows bilateral kidney stones.  However, her symptoms are always related to eating foods. She has vitamin D deficiency and she has been taking vitamin D3 2000 units daily. Past Medical History:  Diagnosis Date  . Endometriosis 11/10/2009  . Gastritis       Family History  Problem Relation Age of Onset  . Liver disease Mother   . Cancer Father     Social History   Social History Narrative   Married for 7 years.Lives with husband and 2 daughters and 1 grandaughter.   Social History   Tobacco Use  . Smoking status: Former Smoker    Types: Cigarettes  . Smokeless tobacco: Former Engineer, water Use Topics  . Alcohol use: Not Currently    Current Meds  Medication Sig  . chlorhexidine (PERIDEX) 0.12 % solution Use as directed 15 mLs in the mouth or throat 2 (two) times daily.  . Cholecalciferol (VITAMIN D3) 50 MCG (2000 UT) capsule Take 2,000 Units by mouth daily.  Marland Kitchen ibuprofen (ADVIL) 200 MG tablet Take 400 mg by mouth every 6 (six) hours as needed.       Objective:   Today's Vitals: BP 120/80 (BP Location: Right Arm, Patient Position:  Sitting, Cuff Size: Normal)   Pulse (!) 103   Temp 97.9 F (36.6 C) (Temporal)   Ht 5\' 5"  (1.651 m)   Wt 106 lb 12.8 oz (48.4 kg)   SpO2 98%   BMI 17.77 kg/m  Vitals with BMI 03/27/2020 03/06/2020 02/21/2020  Height 5\' 5"  5\' 5"  5\' 5"   Weight 106 lbs 13 oz 113 lbs 113 lbs 13 oz  BMI 17.77 18.8 18.94  Systolic 120 104 02/23/2020  Diastolic 80 86 75  Pulse 103 85 84     Physical Exam   Shook systemically well but she has lost a further 7 pounds.    Assessment   1. Weight loss   2. Vitamin D deficiency disease   3. Lower abdominal pain   4. Cervical cancer screening       Tests ordered Orders Placed This Encounter  Procedures  . COMPLETE METABOLIC PANEL WITH GFR  . VITAMIN D 25 Hydroxy (Vit-D Deficiency, Fractures)  . Sedimentation Rate  . Ambulatory referral to Obstetrics / Gynecology     Plan: 1. Blood work is ordered. 2. I am slightly concerned about the weight loss and she tells me that she cannot eat because her teeth are initiated. 3. We will also send her to OB/GYN to see if she does need a Pap smear.  She did have a hysterectomy long time ago.  4. She will keep her appointment with the gastroenterologist in about a week's time and I imagine she will need further evaluation.  I wonder if she does have gallbladder problems also. 5. Follow-up with Judson Roch in 3 months time.   No orders of the defined types were placed in this encounter.   Doree Albee, MD

## 2020-04-03 ENCOUNTER — Encounter: Payer: Self-pay | Admitting: Gastroenterology

## 2020-04-03 ENCOUNTER — Ambulatory Visit: Payer: Medicaid Other | Admitting: Gastroenterology

## 2020-04-03 ENCOUNTER — Telehealth: Payer: Self-pay

## 2020-04-03 ENCOUNTER — Other Ambulatory Visit: Payer: Self-pay

## 2020-04-03 VITALS — BP 131/72 | HR 92 | Temp 97.3°F | Ht 65.0 in | Wt 108.6 lb

## 2020-04-03 DIAGNOSIS — R1011 Right upper quadrant pain: Secondary | ICD-10-CM

## 2020-04-03 DIAGNOSIS — K219 Gastro-esophageal reflux disease without esophagitis: Secondary | ICD-10-CM

## 2020-04-03 DIAGNOSIS — R1013 Epigastric pain: Secondary | ICD-10-CM | POA: Diagnosis not present

## 2020-04-03 DIAGNOSIS — R112 Nausea with vomiting, unspecified: Secondary | ICD-10-CM

## 2020-04-03 MED ORDER — PANTOPRAZOLE SODIUM 40 MG PO TBEC: 40.0000 mg | DELAYED_RELEASE_TABLET | Freq: Every day | ORAL | 5 refills | Status: DC

## 2020-04-03 NOTE — Telephone Encounter (Signed)
[place] abd ruq scheduled for 04/06/20 at 7:30am, arrive at 7:15am. NPO after midnight prior to test.  Called and informed pt of [place] appt. Letter sent to MyChart per pt request.  PA for [place] abd ruq submitted via [employer]. Case approved. PA# [id number], valid 04/03/20-09/30/20.

## 2020-04-03 NOTE — Patient Instructions (Signed)
1. Ultrasound as scheduled. We will contact you with results as available. 2. Start pantoprazole once daily before breakfast. Prescription sent to pharmacy.  3. If gallbladder work up is negative, we will offer an upper endoscopy for further evaluation of your symptoms.

## 2020-04-03 NOTE — Progress Notes (Signed)
Primary Care Physician:  Wilson Singer, MD  Primary Gastroenterologist:  Roetta Sessions, MD   Chief Complaint  Patient presents with  . right flank pain    worse with eating and at night, becoming more constant x 1 year, pain been going on x 4 years total  . Nausea    w/ vomiting if she eats too much, comes back up very strong acid. Reports she can't eat meat.    HPI:  Yvette Morales is a 34 y.o. female here for further evaluation of acute on chronic right upper quadrant pain associated with nausea and vomiting at the request of Dr. Karilyn Cota.  Patient states she has had symptoms for about 5 years.  She first noted abdominal pain 5 years ago after breast surgery for benign disease.  She states she was given hydrocodone postoperatively and on the third night she developed severe hives all over her body.  2 weeks after that she started having GI issues.  She states she has frequent vomiting of yellow material, diarrhea associated with right upper quadrant pain/right upper flank pain.  Has been to the emergency department several times and provided diagnoses of gastritis and eventually was told she probably had an ulcer.  She has been on Carafate for months off and on with some control of her symptoms.  She notes aggression of symptoms occurring more frequently.  She has had so much vomiting that she states she is lost her upper teeth and has a full upper dentures plate.  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  She has a very poor appetite.  She is afraid to eat or drink.  Even sometimes water makes her vomit.  Complains of frequent regurgitation of acidic material, belching.  Weight is down 6 to 8 pounds.  Denies hematemesis.  No dysphagia.  Stools can be frequent especially after certain foods.  Denies constipation.  Denies oil in the stool.  She consumes a very bland diet.  Drinks water or nondairy milk.  Drinks coconut water and sometimes juice.  No  carbonated beverages.  No alcohol.  Describes feeling lightheaded most of the times because she is dehydrated and not eating adequately.  The last decent meal was 3 nights ago, she consumed homemade chicken and dumplings but then she "paid for it" having multiple mucousy stools associated with abdominal pain in the right upper quadrant.  In the past she has been on Zantac and Carafate for the symptoms.  Denies any aspirin use.  She does utilize ibuprofen at times for right upper quadrant pain.  She reports Monterrosa stools at times, feels like it is associated to Carafate.  Denies Pepto-Bismol.  No recent blood in the stool.  She has seen possible blood in the stool on 1 occasion in the remote past.  Previous work-up available to [place] includes labs as outlined below which were unremarkable.  CT renal stone study January 2020 showed scattered tiny nonobstructing stones bilaterally, bilateral renal cyst.  Family history significant for mother deceased due to cirrhosis of the liver.  Father deceased secondary to metastatic thyroid cancer.  He ended up with a colostomy for unclear reasons.  Paternal uncle and paternal cousin with colon cancer at a young age, less than 80.  Maternal cousin with stomach cancer.  Current Outpatient Medications  Medication Sig Dispense Refill  . chlorhexidine (PERIDEX) 0.12 % solution Use as directed 15 mLs in the mouth or throat 2 (  two) times daily. 120 mL 0  . Cholecalciferol (VITAMIN D3) 125 MCG (5000 UT) TABS Take 5,000 Units by mouth daily.     . Ensure (ENSURE) Take 237 mLs by mouth in the morning, at noon, in the evening, and at bedtime.    Marland Kitchen ibuprofen (ADVIL) 200 MG tablet Take 400 mg by mouth every 6 (six) hours as needed.    . Multiple Vitamin (MULTIVITAMIN) tablet Take 1 tablet by mouth daily.    . NON FORMULARY CBD Oil few drops 3 times per day     No current facility-administered medications for this visit.    Allergies as of 04/03/2020 - Review Complete  04/03/2020  Allergen Reaction Noted  . Codeine Rash and Hives 02/15/2015  . Hydrocodone-acetaminophen Hives 10/08/2016  . Latex Rash and Hives 07/05/2015  . Penicillins Hives and Other (See Comments) 02/15/2015  . Sulfa antibiotics Rash 07/05/2015  . Morphine and related Other (See Comments) 06/11/2018  . Morphine Other (See Comments) 02/15/2015    Past Medical History:  Diagnosis Date  . Endometriosis 11/10/2009  . Gastritis     Past Surgical History:  Procedure Laterality Date  . ABDOMINAL HYSTERECTOMY  2011   L Oophrectomy .  Marland Kitchen BREAST SURGERY    . LAPAROSCOPY      Family History  Problem Relation Age of Onset  . Liver disease Mother        etoh cirrhosis, drank after husbands illness  . Thyroid cancer Father        lymph nodes, had part of intestines removed (colostomy)  . Lupus Maternal Aunt   . Colon cancer Paternal Uncle        younger than age 22  . Colon cancer Cousin        age 72, on dad's side  . Stomach cancer Cousin        mother's side  . Celiac disease Neg Hx   . Inflammatory bowel disease Neg Hx     Social History   Socioeconomic History  . Marital status: Married    Spouse name: Not on file  . Number of children: Not on file  . Years of education: Not on file  . Highest education level: Not on file  Occupational History  . Occupation: unemployed  Tobacco Use  . Smoking status: Current Every Day Smoker    Types: Cigarettes  . Smokeless tobacco: Former Engineer, water and Sexual Activity  . Alcohol use: Not Currently  . Drug use: Yes    Types: Marijuana    Comment: only when she can get it  . Sexual activity: Yes  Other Topics Concern  . Not on file  Social History Narrative   Married for 7 years.Lives with husband and 2 daughters and 1 grandaughter.   Social Determinants of Health   Financial Resource Strain:   . Difficulty of Paying Living Expenses:   Food Insecurity:   . Worried About Programme researcher, broadcasting/film/video in the Last Year:   .  Barista in the Last Year:   Transportation Needs:   . Freight forwarder (Medical):   Marland Kitchen Lack of Transportation (Non-Medical):   Physical Activity:   . Days of Exercise per Week:   . Minutes of Exercise per Session:   Stress:   . Feeling of Stress :   Social Connections:   . Frequency of Communication with Friends and Family:   . Frequency of Social Gatherings with Friends and Family:   . Attends  Religious Services:   . Active Member of Clubs or Organizations:   . Attends Archivist Meetings:   Marland Kitchen Marital Status:   Intimate Partner Violence:   . Fear of Current or Ex-Partner:   . Emotionally Abused:   Marland Kitchen Physically Abused:   . Sexually Abused:       ROS:  General: Negative for   fever, chills, fatigue.  See HPI Eyes: Negative for vision changes.  ENT: Negative for hoarseness, difficulty swallowing , nasal congestion. CV: Negative for chest pain, angina, palpitations, dyspnea on exertion, peripheral edema.  Respiratory: Negative for dyspnea at rest, dyspnea on exertion, cough, sputum, wheezing.  GI: See history of present illness. GU:  Negative for dysuria, hematuria, urinary incontinence, urinary frequency, nocturnal urination.  MS: Negative for joint pain, low back pain.  Derm: Negative for rash or itching.  Neuro: Negative for weakness, abnormal sensation, seizure, frequent headaches, memory loss, confusion.  Psych: Negative for anxiety, depression, suicidal ideation, hallucinations.  Endo: See HPI Heme: Negative for bruising or bleeding. Allergy: Negative for rash or hives.    Physical Examination:  BP 131/72   Pulse 92   Temp (!) 97.3 F (36.3 C) (Oral)   Ht 5\' 5"  (1.651 m)   Wt 108 lb 9.6 oz (49.3 kg)   BMI 18.07 kg/m    General: Well-nourished, well-developed in no acute distress.  Head: Normocephalic, atraumatic.   Eyes: Conjunctiva pink, no icterus. Mouth: masked Neck: Supple without thyromegaly, masses, or lymphadenopathy.  Lungs:  Clear to auscultation bilaterally.  Heart: Regular rate and rhythm, no murmurs rubs or gallops.  Abdomen: Bowel sounds are normal,  nondistended, no hepatosplenomegaly or masses, no abdominal bruits or    hernia , no rebound or guarding.  Ruq/epig tenderness with deep palpation.  Positive Murphy's sign. Rectal: not performed Extremities: No lower extremity edema. No clubbing or deformities.  Neuro: Alert and oriented x 4 , grossly normal neurologically.  Skin: Warm and dry, no rash or jaundice.   Psych: Alert and cooperative, normal mood and affect.  Labs: Lab Results  Component Value Date   CREATININE 0.98 03/27/2020   BUN 11 03/27/2020   NA 137 03/27/2020   K 4.8 03/27/2020   CL 105 03/27/2020   CO2 25 03/27/2020   Lab Results  Component Value Date   ALT 12 03/27/2020   AST 17 03/27/2020   ALKPHOS 47 12/05/2018   BILITOT 0.7 03/27/2020   Lab Results  Component Value Date   WBC 6.9 02/21/2020   HGB 13.2 02/21/2020   HCT 41.2 02/21/2020   MCV 89.6 02/21/2020   PLT 271 02/21/2020   Lab Results  Component Value Date   TSH 1.11 02/21/2020   Lab Results  Component Value Date   ESRSEDRATE 2 03/27/2020   Lab Results  Component Value Date   HGBA1C 4.7 02/21/2020     Imaging Studies: No results found.  Impression/plan:  Pleasant 34 year old female with 5-year history of progressive abdominal pain.  Symptoms used to be intermittent but now they are more frequent.  Describes postprandial epigastric/right upper quadrant pain which radiates into the right flank.  Symptoms associated with nausea and vomiting and frequent stool.  She is lost 6 to 8 pounds due to food avoidance because of her symptoms.  Sometimes she gets sick with even just liquids.  Recent labs with normal LFTs and CBC.  Normal kidney function and TSH.  A1c is normal.  Differential diagnosis includes gallbladder disease, gastritis/peptic ulcer disease/severe GERD, less  likely pancreatic disease or IBD.   Initially will start her on a PPI, pantoprazole 40 mg daily.  Would asked that she limit NSAIDs as much as possible.  Right upper quadrant ultrasound scheduled.  If ultrasound is negative, will pursue HIDA with fatty meal challenge to complete gallbladder work-up.  If gallbladder work-up is unremarkable, next step would be upper endoscopy.  Patient aware of plan.

## 2020-04-06 ENCOUNTER — Ambulatory Visit (HOSPITAL_COMMUNITY)
Admission: RE | Admit: 2020-04-06 | Discharge: 2020-04-06 | Disposition: A | Discharge status: Home / Self Care | Payer: Medicaid Other | Source: Ambulatory Visit | Attending: Gastroenterology | Admitting: Gastroenterology

## 2020-04-06 ENCOUNTER — Other Ambulatory Visit: Payer: Self-pay

## 2020-04-06 DIAGNOSIS — R112 Nausea with vomiting, unspecified: Secondary | ICD-10-CM | POA: Diagnosis not present

## 2020-04-06 DIAGNOSIS — K219 Gastro-esophageal reflux disease without esophagitis: Secondary | ICD-10-CM | POA: Diagnosis not present

## 2020-04-06 DIAGNOSIS — R110 Nausea: Secondary | ICD-10-CM | POA: Diagnosis not present

## 2020-04-06 DIAGNOSIS — R1013 Epigastric pain: Secondary | ICD-10-CM | POA: Diagnosis not present

## 2020-04-06 DIAGNOSIS — R1011 Right upper quadrant pain: Secondary | ICD-10-CM | POA: Insufficient documentation

## 2020-04-11 ENCOUNTER — Other Ambulatory Visit: Payer: Self-pay

## 2020-04-11 DIAGNOSIS — R1011 Right upper quadrant pain: Secondary | ICD-10-CM

## 2020-04-11 DIAGNOSIS — R112 Nausea with vomiting, unspecified: Secondary | ICD-10-CM

## 2020-04-17 ENCOUNTER — Encounter (HOSPITAL_COMMUNITY)
Admission: RE | Admit: 2020-04-17 | Discharge: 2020-04-17 | Disposition: A | Discharge status: Home / Self Care | Payer: Medicaid Other | Source: Ambulatory Visit | Attending: Gastroenterology | Admitting: Gastroenterology

## 2020-04-17 ENCOUNTER — Other Ambulatory Visit: Payer: Self-pay

## 2020-04-17 ENCOUNTER — Other Ambulatory Visit (HOSPITAL_COMMUNITY)
Admission: RE | Admit: 2020-04-17 | Discharge: 2020-04-17 | Disposition: A | Discharge status: Home / Self Care | Payer: Medicaid Other | Source: Ambulatory Visit | Attending: Obstetrics and Gynecology | Admitting: Obstetrics and Gynecology

## 2020-04-17 ENCOUNTER — Encounter (HOSPITAL_COMMUNITY): Payer: Self-pay

## 2020-04-17 ENCOUNTER — Ambulatory Visit (INDEPENDENT_AMBULATORY_CARE_PROVIDER_SITE_OTHER): Payer: Medicaid Other | Admitting: Women's Health

## 2020-04-17 ENCOUNTER — Encounter: Payer: Self-pay | Admitting: Women's Health

## 2020-04-17 VITALS — BP 118/76 | HR 74 | Ht 65.0 in | Wt 106.0 lb

## 2020-04-17 DIAGNOSIS — N898 Other specified noninflammatory disorders of vagina: Secondary | ICD-10-CM | POA: Insufficient documentation

## 2020-04-17 DIAGNOSIS — Z0000 Encounter for general adult medical examination without abnormal findings: Secondary | ICD-10-CM | POA: Diagnosis not present

## 2020-04-17 DIAGNOSIS — N941 Unspecified dyspareunia: Secondary | ICD-10-CM | POA: Insufficient documentation

## 2020-04-17 DIAGNOSIS — R1011 Right upper quadrant pain: Secondary | ICD-10-CM | POA: Insufficient documentation

## 2020-04-17 DIAGNOSIS — R112 Nausea with vomiting, unspecified: Secondary | ICD-10-CM | POA: Insufficient documentation

## 2020-04-17 DIAGNOSIS — Z01419 Encounter for gynecological examination (general) (routine) without abnormal findings: Secondary | ICD-10-CM

## 2020-04-17 DIAGNOSIS — Z90710 Acquired absence of both cervix and uterus: Secondary | ICD-10-CM | POA: Diagnosis not present

## 2020-04-17 DIAGNOSIS — Z9889 Other specified postprocedural states: Secondary | ICD-10-CM | POA: Diagnosis not present

## 2020-04-17 MED ORDER — SINCALIDE 5 MCG IJ SOLR
INTRAMUSCULAR | Status: AC
  Administered 2020-04-17: 0.98 ug via INTRAVENOUS
  Filled 2020-04-17: qty 5

## 2020-04-17 MED ORDER — STERILE WATER FOR INJECTION IJ SOLN
INTRAMUSCULAR | Status: AC
  Administered 2020-04-17: 0.98 mL via INTRAVENOUS
  Filled 2020-04-17: qty 10

## 2020-04-17 MED ORDER — TECHNETIUM TC 99M MEBROFENIN IV KIT
5.0000 | PACK | Freq: Once | INTRAVENOUS | Status: AC | PRN
  Administered 2020-04-17: 4.9 via INTRAVENOUS

## 2020-04-17 NOTE — Progress Notes (Signed)
Breast & Pelvic EXAMINATION Patient name: Yvette Morales MRN [id number]  Date of birth: 12-Dec-1985 Chief Complaint:   Gynecologic Exam  History of Present Illness:   Yvette Morales is a 34 y.o. G76P2012 African American female being seen today for a routine breast & pelvic exam only, had physical w/ PCP.  Current complaints: h/o Rt breast mass excision 2018, was told to have yearly mammograms and hasn't since her f/u in 2018.  Reports dyspareunia, vaginal irritation. Just finished antibiotic, thought she had yeast infection, so did otc monistat 7 which didn't really help much. Uses natural based lubricant, but recently has started burning. Had hysterectomy & Lt oophorectomy at age 32 d/t very heavy periods w/ multiple blood transfusions. Unsure if they took her cervix.   Depression screen [facility], [facility], [facility] 2/9 04/17/2020 02/21/2020  Decreased Interest 1 0  Down, Depressed, Hopeless 1 0  PHQ - 2 Score 2 0  Altered sleeping 2 -  Tired, decreased energy 3 -  Change in appetite 3 -  Feeling bad or failure about yourself  1 -  Trouble concentrating 1 -  Moving slowly or fidgety/restless 0 -  Suicidal thoughts 0 -  PHQ-9 Score 12 -     PCP: Anastasio Champion      does not desire labs-done w/ PCP No LMP recorded. Patient has had a hysterectomy. The current method of family planning is status post hysterectomy.  Last pap >35yrs. Results were: normal. Last mammogram: 2018. Results were: normal. Family h/o breast cancer: no Last colonoscopy: never. Results were: N/A. Family h/o colorectal cancer: yes PatCousin age 52, PatUncle Review of Systems:   Pertinent items are noted in HPI Denies any headaches, blurred vision, fatigue, shortness of breath, chest pain, abdominal pain, abnormal vaginal discharge/itching/odor/irritation, problems with periods, bowel movements, urination, or intercourse unless otherwise stated above. Pertinent History Reviewed:  Reviewed past medical,surgical, social and family history.    Reviewed problem list, medications and allergies. Physical Assessment:   Vitals:   04/17/20 1344  BP: 118/76  Pulse: 74  Weight: 106 lb (48.1 kg)  Height: 5\' 5"  (1.651 m)  Body mass index is 17.64 kg/m.        Physical Examination:   General appearance - well appearing, and in no distress  Mental status - alert, oriented to person, place, and time  Psych:  She has a normal mood and affect  Skin - warm and dry, normal color, no suspicious lesions noted  Chest - effort normal  Heart - normal rate   Neck:  Note examined  Breasts - breasts appear normal, no suspicious masses, no skin or nipple changes or axillary nodes  Abdomen - soft, nontender, nondistended, no masses or organomegaly  Pelvic - VULVA: normal appearing vulva with no masses, tenderness or lesions  VAGINA: normal appearing vagina with normal color and discharge, no lesions  CERVIX: surgically absent  Thin prep pap is not done-no cervix  UTERUS-surgically absent  ADNEXA: No adnexal masses or tenderness noted.  Extremities:  No swelling or varicosities noted  Chaperone: Levy Pupa    No results found for this or any previous visit (from the past 24 hour(s)).  Assessment & Plan:  1) Breast & Pelvic Exam  2) S/P hysterectomy w/ Lt oophorectomy @ 34yo d/t very heavy periods w/ multiple blood transfusions. No cx and no h/o cervical cancer, so no paps needed  3) Dyspareunia> send CV swab, use mineral or canola oil for lubrication, if dyspareunia continues let me know  4)  H/O Rt breast mass excision> was told to do yearly mammograms, last mammogram 2018, so scheduled for 6/9 @ [facility] @ 1pm  Labs/procedures today: CV swab  Mammogram-Friday  Colonoscopy @ 34yo, or sooner if problems  Orders Placed This Encounter  Procedures  . MM 3D SCREEN BREAST BILATERAL    Meds: No orders of the defined types were placed in this encounter.   Follow-up: Return for prn.  Cheral Marker CNM, [facility] 04/17/2020 2:24 PM

## 2020-04-17 NOTE — Patient Instructions (Addendum)
Canola or mineral oil  Breast mammogram Friday 6/11 @ 1pm at [facility], be there at 12:45pm, no lotion/deoderant/powder/perfume that day

## 2020-04-19 LAB — CERVICOVAGINAL ANCILLARY ONLY
Bacterial Vaginitis (gardnerella): NEGATIVE
Candida Glabrata: POSITIVE — AB
Candida Vaginitis: POSITIVE — AB
Chlamydia: NEGATIVE
Comment: NEGATIVE
Comment: NEGATIVE
Comment: NEGATIVE
Comment: NEGATIVE
Comment: NEGATIVE
Comment: NORMAL
Neisseria Gonorrhea: NEGATIVE
Trichomonas: NEGATIVE

## 2020-04-20 ENCOUNTER — Other Ambulatory Visit: Payer: Self-pay | Admitting: Women's Health

## 2020-04-20 ENCOUNTER — Ambulatory Visit (HOSPITAL_COMMUNITY): Payer: Medicaid Other

## 2020-04-20 MED ORDER — FLUCONAZOLE 150 MG PO TABS
150.0000 mg | ORAL_TABLET | Freq: Once | ORAL | 0 refills | Status: AC
Start: 2020-04-20 — End: 2020-04-20

## 2020-04-26 ENCOUNTER — Inpatient Hospital Stay (HOSPITAL_COMMUNITY): Admission: RE | Admit: 2020-04-26 | Payer: Medicaid Other | Source: Ambulatory Visit

## 2020-04-26 ENCOUNTER — Encounter (HOSPITAL_COMMUNITY): Payer: Self-pay

## 2020-05-21 ENCOUNTER — Telehealth: Payer: Self-pay | Admitting: Gastroenterology

## 2020-05-21 NOTE — Telephone Encounter (Signed)
CALLED PT. She is scheduled for EGD with RMR with propofol for 8/5 at 2:45pm. Patient aware will need covid test prior. Also aware will send this appt and her instructions to her mychart. She voiced understanding.

## 2020-05-21 NOTE — Telephone Encounter (Signed)
PATIENT RETURNED CALL TO SCHEDULE HER PROCEDURE

## 2020-05-31 ENCOUNTER — Encounter (HOSPITAL_COMMUNITY): Payer: Self-pay | Admitting: Internal Medicine

## 2020-05-31 ENCOUNTER — Encounter (HOSPITAL_COMMUNITY)
Admission: RE | Admit: 2020-05-31 | Discharge: 2020-05-31 | Disposition: A | Discharge status: Home / Self Care | Payer: Medicaid Other | Source: Ambulatory Visit | Attending: Internal Medicine | Admitting: Internal Medicine

## 2020-05-31 ENCOUNTER — Other Ambulatory Visit: Payer: Self-pay

## 2020-06-12 ENCOUNTER — Telehealth: Payer: Self-pay | Admitting: *Deleted

## 2020-06-12 ENCOUNTER — Other Ambulatory Visit (HOSPITAL_COMMUNITY)
Admission: RE | Admit: 2020-06-12 | Discharge: 2020-06-12 | Disposition: A | Discharge status: Home / Self Care | Payer: Medicaid Other | Source: Ambulatory Visit | Attending: Internal Medicine | Admitting: Internal Medicine

## 2020-06-12 NOTE — Telephone Encounter (Signed)
PA approved. Auth# [id number] dates 06/12/2020-08/11/2020

## 2020-06-12 NOTE — Telephone Encounter (Signed)
Received call from pre-service center PA is required for EGD with [employer] med[employer].  PA initiated via [employer]. Reference Number: [id number]

## 2020-06-12 NOTE — Progress Notes (Signed)
Patient had an appointment for today and tomorrow. Called patient she is coming in tomorrow at 10:05. Registration and Tomi are aware. Nothing further needed.

## 2020-06-13 ENCOUNTER — Other Ambulatory Visit: Payer: Self-pay

## 2020-06-13 ENCOUNTER — Other Ambulatory Visit (HOSPITAL_COMMUNITY)
Admission: RE | Admit: 2020-06-13 | Discharge: 2020-06-13 | Disposition: A | Discharge status: Home / Self Care | Payer: Medicaid Other | Source: Ambulatory Visit | Attending: Internal Medicine | Admitting: Internal Medicine

## 2020-06-13 DIAGNOSIS — Z01812 Encounter for preprocedural laboratory examination: Secondary | ICD-10-CM | POA: Insufficient documentation

## 2020-06-13 DIAGNOSIS — Z20822 Contact with and (suspected) exposure to covid-19: Secondary | ICD-10-CM | POA: Diagnosis not present

## 2020-06-13 LAB — SARS CORONAVIRUS 2 (TAT 6-24 HRS): SARS Coronavirus 2: NEGATIVE

## 2020-06-14 ENCOUNTER — Ambulatory Visit (HOSPITAL_COMMUNITY): Payer: Medicaid Other | Admitting: Anesthesiology

## 2020-06-14 ENCOUNTER — Encounter (HOSPITAL_COMMUNITY): Payer: Self-pay | Admitting: Internal Medicine

## 2020-06-14 ENCOUNTER — Ambulatory Visit (HOSPITAL_COMMUNITY)
Admission: RE | Admit: 2020-06-14 | Discharge: 2020-06-14 | Disposition: A | Discharge status: Home / Self Care | Payer: Medicaid Other | Attending: Internal Medicine | Admitting: Internal Medicine

## 2020-06-14 ENCOUNTER — Encounter (HOSPITAL_COMMUNITY)
Admission: RE | Disposition: A | Discharge status: Home / Self Care | Payer: Self-pay | Source: Home / Self Care | Attending: Internal Medicine

## 2020-06-14 DIAGNOSIS — Z8719 Personal history of other diseases of the digestive system: Secondary | ICD-10-CM | POA: Insufficient documentation

## 2020-06-14 DIAGNOSIS — R112 Nausea with vomiting, unspecified: Secondary | ICD-10-CM | POA: Diagnosis not present

## 2020-06-14 DIAGNOSIS — F17210 Nicotine dependence, cigarettes, uncomplicated: Secondary | ICD-10-CM | POA: Insufficient documentation

## 2020-06-14 DIAGNOSIS — R1011 Right upper quadrant pain: Secondary | ICD-10-CM | POA: Insufficient documentation

## 2020-06-14 DIAGNOSIS — G8929 Other chronic pain: Secondary | ICD-10-CM | POA: Insufficient documentation

## 2020-06-14 DIAGNOSIS — K219 Gastro-esophageal reflux disease without esophagitis: Secondary | ICD-10-CM | POA: Diagnosis not present

## 2020-06-14 HISTORY — PX: ESOPHAGOGASTRODUODENOSCOPY (EGD) WITH PROPOFOL: SHX5813

## 2020-06-14 HISTORY — PX: BIOPSY: SHX5522

## 2020-06-14 SURGERY — ESOPHAGOGASTRODUODENOSCOPY (EGD) WITH PROPOFOL
Anesthesia: General

## 2020-06-14 MED ORDER — CHLORHEXIDINE GLUCONATE CLOTH 2 % EX PADS: 6.0000 | MEDICATED_PAD | Freq: Once | CUTANEOUS | Status: DC

## 2020-06-14 MED ORDER — STERILE WATER FOR IRRIGATION IR SOLN
Status: DC | PRN
  Administered 2020-06-14: 1.5 mL

## 2020-06-14 MED ORDER — LIDOCAINE VISCOUS HCL 2 % MT SOLN
OROMUCOSAL | Status: AC
  Filled 2020-06-14: qty 15

## 2020-06-14 MED ORDER — LACTATED RINGERS IV SOLN: INTRAVENOUS | Status: DC | PRN

## 2020-06-14 MED ORDER — LIDOCAINE VISCOUS HCL 2 % MT SOLN
15.0000 mL | Freq: Once | OROMUCOSAL | Status: AC
  Administered 2020-06-14: 15 mL via OROMUCOSAL

## 2020-06-14 MED ORDER — LACTATED RINGERS IV SOLN: Freq: Once | INTRAVENOUS | Status: AC

## 2020-06-14 MED ORDER — LIDOCAINE HCL (CARDIAC) PF 50 MG/5ML IV SOSY
PREFILLED_SYRINGE | INTRAVENOUS | Status: DC | PRN
Start: 2020-06-14 — End: 2020-06-14
  Administered 2020-06-14: 60 mg via INTRAVENOUS

## 2020-06-14 MED ORDER — GLYCOPYRROLATE 0.2 MG/ML IJ SOLN: 0.2000 mg | Freq: Once | INTRAMUSCULAR | Status: DC

## 2020-06-14 MED ORDER — GLYCOPYRROLATE PF 0.2 MG/ML IJ SOSY
PREFILLED_SYRINGE | INTRAMUSCULAR | Status: AC
  Administered 2020-06-14: 0.2 mg via INTRAVENOUS
  Filled 2020-06-14: qty 1

## 2020-06-14 MED ORDER — PROPOFOL 500 MG/50ML IV EMUL
INTRAVENOUS | Status: DC | PRN
  Administered 2020-06-14: 175 ug/kg/min via INTRAVENOUS
  Administered 2020-06-14: 100 mg via INTRAVENOUS

## 2020-06-14 NOTE — Anesthesia Postprocedure Evaluation (Signed)
Anesthesia Post Note  Patient: Yvette Morales  Procedure(s) Performed: ESOPHAGOGASTRODUODENOSCOPY (EGD) WITH PROPOFOL (N/A ) BIOPSY  Patient location during evaluation: Endoscopy Anesthesia Type: General Level of consciousness: awake, oriented, awake and alert and patient cooperative Pain management: pain level controlled Vital Signs Assessment: post-procedure vital signs reviewed and stable Respiratory status: spontaneous breathing, respiratory function stable and nonlabored ventilation Cardiovascular status: blood pressure returned to baseline and stable Postop Assessment: no headache and no backache Anesthetic complications: no   No complications documented.   Last Vitals:  Vitals:   06/14/20 1156  BP: 105/68  Pulse: 70  Resp: 18  Temp: 36.5 C  SpO2: 99%    Last Pain:  Vitals:   06/14/20 1223  TempSrc:   PainSc: 3                  Brynda Peon

## 2020-06-14 NOTE — Discharge Instructions (Signed)
EGD Discharge instructions Please read the instructions outlined below and refer to this sheet in the next few weeks. These discharge instructions provide you with general information on caring for yourself after you leave the hospital. Your doctor may also give you specific instructions. While your treatment has been planned according to the most current medical practices available, unavoidable complications occasionally occur. If you have any problems or questions after discharge, please call your doctor. ACTIVITY  You may resume your regular activity but move at a slower pace for the next 24 hours.   Take frequent rest periods for the next 24 hours.   Walking will help expel (get rid of) the air and reduce the bloated feeling in your abdomen.   No driving for 24 hours (because of the anesthesia (medicine) used during the test).   You may shower.   Do not sign any important legal documents or operate any machinery for 24 hours (because of the anesthesia used during the test).  NUTRITION  Drink plenty of fluids.   You may resume your normal diet.   Begin with a light meal and progress to your normal diet.   Avoid alcoholic beverages for 24 hours or as instructed by your caregiver.  MEDICATIONS  You may resume your normal medications unless your caregiver tells you otherwise.  WHAT YOU CAN EXPECT TODAY  You may experience abdominal discomfort such as a feeling of fullness or "gas" pains.  FOLLOW-UP  Your doctor will discuss the results of your test with you.  SEEK IMMEDIATE MEDICAL ATTENTION IF ANY OF THE FOLLOWING OCCUR:  Excessive nausea (feeling sick to your stomach) and/or vomiting.   Severe abdominal pain and distention (swelling).   Trouble swallowing.   Temperature over 101 F (37.8 C).   Rectal bleeding or vomiting of blood.    Your upper GI tract looked good today  Biopsies of your stomach and small intestine were taken  Further recommendations to follow  pending review of pathology report  At patient request, I called Bobbye Charleston at [phone] -rolled to voicemail-"voicemail not set up yet"

## 2020-06-14 NOTE — H&P (Signed)
@LOGO @   Primary Care Physician:  , MD Primary Gastroenterologist:  Dr. Wilson Singer  Pre-Procedure History & Physical: HPI:  Yvette Morales is a 34 y.o. female here for further evaluation of a 4-year history of right upper quadrant abdominal pain nausea vomiting, indigestion worsened by eating but relieved with a bowel movement.  Noncontrast CT of the abdomen demonstrated kidney stones but no GI pathology.  Ultrasound and HIDA with EF both came back negative/normal.  No prior EGD.  Patient denies dysphagia.  Daily ibuprofen use 1 to 2 tablets. Patient told she had gluten allergy.  Past Medical History:  Diagnosis Date  . Endometriosis 11/10/2009  . Gastritis     Past Surgical History:  Procedure Laterality Date  . ABDOMINAL HYSTERECTOMY  2011   L Oophrectomy .  2012 BREAST SURGERY    . LAPAROSCOPY      Prior to Admission medications   Medication Sig Start Date End Date Taking? Authorizing Provider  BIOTIN PO Take 2 tablets by mouth daily.   Yes [provider]  Cholecalciferol (VITAMIN D3) 125 MCG (5000 UT) TABS Take 5,000 Units by mouth daily.    Yes [provider]  Ensure (ENSURE) Take 237 mLs by mouth in the morning, at noon, in the evening, and at bedtime.   Yes [provider]  ibuprofen (ADVIL) 200 MG tablet Take 400-600 mg by mouth every 6 (six) hours as needed for headache or moderate pain.    Yes [provider]  Multiple Vitamin (MULTIVITAMIN) tablet Take 1 tablet by mouth daily.   Yes [provider]  nicotine (NICODERM CQ - DOSED IN MG/24 HOURS) 14 mg/24hr patch Place 14 mg onto the skin daily as needed (nicotine dependence).   Yes [provider]  OVER THE COUNTER MEDICATION Take 2-4 drops by mouth daily as needed (appetite/nausea). CBD drops   Yes [provider]  chlorhexidine (PERIDEX) 0.12 % solution Use as directed 15 mLs in the mouth or throat 2 (two) times daily. Patient not taking:  Reported on 04/17/2020 03/06/20   Albrizze, 03/08/20 E, PA-C  pantoprazole (PROTONIX) 40 MG tablet Take 1 tablet (40 mg total) by mouth daily before breakfast. Patient taking differently: Take 40 mg by mouth daily as needed (acid reflux).  04/03/20   04/05/20, PA-C    Allergies as of 05/21/2020 - Review Complete 05/21/2020  Allergen Reaction Noted  . Codeine Rash and Hives 02/15/2015  . Hydrocodone-acetaminophen Hives 10/08/2016  . Latex Rash and Hives 07/05/2015  . Penicillins Hives and Other (See Comments) 02/15/2015  . Sulfa antibiotics Rash 07/05/2015  . Morphine and related Other (See Comments) 06/11/2018    Family History  Problem Relation Age of Onset  . Liver disease Mother        etoh cirrhosis, drank after husbands illness  . Thyroid cancer Father        lymph nodes, had part of intestines removed (colostomy)  . Lupus Maternal Aunt   . Colon cancer Paternal Uncle        younger than age 74  . Colon cancer Cousin        age 39, on dad's side  . Stomach cancer Cousin        mother's side  . Celiac disease Neg Hx   . Inflammatory bowel disease Neg Hx     Social History   Socioeconomic History  . Marital status: Married    Spouse name: Not on file  . Number of  children: Not on file  . Years of education: Not on file  . Highest education level: Not on file  Occupational History  . Occupation: unemployed  Tobacco Use  . Smoking status: Current Every Day Smoker    Types: Cigarettes  . Smokeless tobacco: Former Neurosurgeon  . Tobacco comment: stated she is using a nicotine patch  Vaping Use  . Vaping Use: Every day  . Substances: CBD, Flavoring, Synthetic cannabinoids  Substance and Sexual Activity  . Alcohol use: Not Currently  . Drug use: Yes    Types: Marijuana    Comment: only when she can get it  . Sexual activity: Yes  Other Topics Concern  . Not on file  Social History Narrative   Married for 7 years.Lives with husband and 2 daughters and 1 grandaughter.    Social Determinants of Health   Financial Resource Strain: Low Risk   . Difficulty of Paying Living Expenses: Not hard at all  Food Insecurity: No Food Insecurity  . Worried About Programme researcher, broadcasting/film/video in the Last Year: Never true  . Ran Out of Food in the Last Year: Never true  Transportation Needs: No Transportation Needs  . Lack of Transportation (Medical): No  . Lack of Transportation (Non-Medical): No  Physical Activity: Insufficiently Active  . Days of Exercise per Week: 3 days  . Minutes of Exercise per Session: 30 min  Stress: Stress Concern Present  . Feeling of Stress : To some extent  Social Connections: Moderately Isolated  . Frequency of Communication with Friends and Family: Three times a week  . Frequency of Social Gatherings with Friends and Family: Once a week  . Attends Religious Services: Never  . Active Member of Clubs or Organizations: No  . Attends Banker Meetings: Never  . Marital Status: Married  Catering manager Violence: Not At Risk  . Fear of Current or Ex-Partner: No  . Emotionally Abused: No  . Physically Abused: No  . Sexually Abused: No    Review of Systems: See HPI, otherwise negative ROS  Physical Exam: BP 105/68   Pulse 70   Temp 97.7 F (36.5 C) (Oral)   Resp 18   Ht 5\' 5"  (1.651 m)   Wt 45.4 kg   SpO2 99%   BMI 16.64 kg/m  General:   Alert,  Well-developed, well-nourished, pleasant and cooperative in NAD Neck:  Supple; no masses or thyromegaly. No significant cervical adenopathy. Lungs:  Clear throughout to auscultation.   No wheezes, crackles, or rhonchi. No acute distress. Heart:  Regular rate and rhythm; no murmurs, clicks, rubs,  or gallops. Abdomen: Non-distended, normal bowel sounds.  Soft and nontender without appreciable mass or hepatosplenomegaly.  Pulses:  Normal pulses noted. Extremities:  Without clubbing or edema.  Impression/Plan: 34 year old lady chronic right upper quadrant abdominal pain  "indigestion" admit nausea vomiting worsened by eating relieved by bowel function.  Work-up negative as described above.  Symptoms somewhat unusual.  No improvement with a PPI.  Recommendations: Agree with need for diagnostic EGD today.  The risks, benefits, limitations, alternatives and imponderables have been reviewed with the patient. Potential for esophageal dilation, biopsy, etc. have also been reviewed.  Questions have been answered. All parties agreeable.     Notice: This dictation was prepared with Dragon dictation along with smaller phrase technology. Any transcriptional errors that result from this process are unintentional and may not be corrected upon review.

## 2020-06-14 NOTE — Op Note (Signed)
[facility] Patient Name: Yvette Morales Procedure Date: 06/14/2020 12:19 PM MRN: [id number] Date of Birth: 1986/08/04 Attending MD: Gennette Pac , MD CSN: [id number] Age: 34 Admit Type: Outpatient Procedure:                Upper GI endoscopy Indications:              Abdominal pain in the right upper quadrant, Nausea                            with vomiting Providers:                Gennette Pac, MD, Loma Messing B. Patsy Lager, RN,                            Dyann Ruddle Referring MD:              Medicines:                Propofol per Anesthesia Complications:            No immediate complications. Estimated Blood Loss:     Estimated blood loss was minimal. Procedure:                Pre-Anesthesia Assessment:                           - Prior to the procedure, a History and Physical                            was performed, and patient medications and                            allergies were reviewed. The patient's tolerance of                            previous anesthesia was also reviewed. The risks                            and benefits of the procedure and the sedation                            options and risks were discussed with the patient.                            All questions were answered, and informed consent                            was obtained. Prior Anticoagulants: The patient has                            taken no previous anticoagulant or antiplatelet                            agents. ASA Grade Assessment: II - A patient with  mild systemic disease. After reviewing the risks                            and benefits, the patient was deemed in                            satisfactory condition to undergo the procedure.                           After obtaining informed consent, the endoscope was                            passed under direct vision. Throughout the                            procedure, the patient's blood  pressure, pulse, and                            oxygen saturations were monitored continuously. The                            [id number]-[id number] ([id number]) scope was introduced through the                            mouth, and advanced to the second part of duodenum.                            The upper GI endoscopy was accomplished without                            difficulty. The patient tolerated the procedure                            well. Scope In: 12:35:03 PM Scope Out: 12:39:51 PM Total Procedure Duration: 0 hours 4 minutes 48 seconds  Findings:      The examined esophagus was normal.      The entire examined stomach was normal.      The duodenal bulb, second portion of the duodenum and third portion of       the duodenum were normal. Biopsies of the duodenal and gastric mucosa       taken. Impression:               - Normal esophagus.                           - Normal stomach.                           - Normal duodenal bulb, second portion of the                            duodenum and third portion of the duodenum.                           -Status post gastric and duodenal  biopsy. Moderate Sedation:      Moderate (conscious) sedation was personally administered by an       anesthesia professional. The following parameters were monitored: oxygen       saturation, heart rate, blood pressure, respiratory rate, EKG, adequacy       of pulmonary ventilation, and response to care. Recommendation:           - Patient has a contact number available for                            emergencies. The signs and symptoms of potential                            delayed complications were discussed with the                            patient. Return to normal activities tomorrow.                            Written discharge instructions were provided to the                            patient.                           - Advance diet as tolerated. Follow-up on                            pathology.  Further recommendations to follow. Procedure Code(s):        --- Professional ---                           [phone], Esophagogastroduodenoscopy, flexible,                            transoral; diagnostic, including collection of                            specimen(s) by brushing or washing, when performed                            (separate procedure) Diagnosis Code(s):        --- Professional ---                           R10.11, Right upper quadrant pain                           R11.2, Nausea with vomiting, unspecified CPT copyright 2019 American Medical Association. All rights reserved. The codes documented in this report are preliminary and upon coder review may  be revised to meet current compliance requirements. Gerrit Friends. Bandon Sherwin, MD Gennette Pac, MD 06/14/2020 12:48:11 PM This report has been signed electronically. Number of Addenda: 0

## 2020-06-14 NOTE — Transfer of Care (Signed)
Immediate Anesthesia Transfer of Care Note  Patient: Yvette Morales  Procedure(s) Performed: ESOPHAGOGASTRODUODENOSCOPY (EGD) WITH PROPOFOL (N/A ) BIOPSY  Patient Location: Endoscopy Unit  Anesthesia Type:General  Level of Consciousness: awake, alert , oriented and patient cooperative  Airway & Oxygen Therapy: Patient Spontanous Breathing and Patient connected to nasal cannula oxygen  Post-op Assessment: Report given to RN, Post -op Vital signs reviewed and stable and Patient moving all extremities  Post vital signs: Reviewed and stable  Last Vitals:  Vitals Value Taken Time  BP    Temp    Pulse    Resp    SpO2      Last Pain:  Vitals:   06/14/20 1223  TempSrc:   PainSc: 3       Patients Stated Pain Goal: 7 (06/14/20 1147)  Complications: No complications documented.

## 2020-06-14 NOTE — Anesthesia Preprocedure Evaluation (Addendum)
Anesthesia Evaluation  Patient identified by MRN, date of birth, ID band Patient awake    Reviewed: Allergy & Precautions, NPO status , Patient's Chart, lab work & pertinent test results  History of Anesthesia Complications Negative for: history of anesthetic complications  Airway Mallampati: II  TM Distance: >3 FB Neck ROM: Full    Dental  (+) Partial Upper, Missing, Dental Advisory Given   Pulmonary Current Smoker (vaping)Patient did not abstain from smoking.,    Pulmonary exam normal breath sounds clear to auscultation       Cardiovascular Exercise Tolerance: Good Normal cardiovascular exam Rhythm:Regular Rate:Normal     Neuro/Psych negative neurological ROS  negative psych ROS   GI/Hepatic GERD  Medicated,(+)     substance abuse (last use - couple of months ago)  marijuana use, Severe abdominal pain, nausea   Endo/Other  negative endocrine ROS  Renal/GU negative Renal ROS     Musculoskeletal negative musculoskeletal ROS (+)   Abdominal   Peds  Hematology negative hematology ROS (+)   Anesthesia Other Findings   Reproductive/Obstetrics negative OB ROS                           Anesthesia Physical Anesthesia Plan  ASA: II  Anesthesia Plan: General   Post-op Pain Management:    Induction:   PONV Risk Score and Plan: TIVA  Airway Management Planned: Nasal Cannula and Natural Airway  Additional Equipment:   Intra-op Plan:   Post-operative Plan:   Informed Consent: I have reviewed the patients History and Physical, chart, labs and discussed the procedure including the risks, benefits and alternatives for the proposed anesthesia with the patient or authorized representative who has indicated his/her understanding and acceptance.     Dental advisory given  Plan Discussed with: CRNA  Anesthesia Plan Comments:         Anesthesia Quick Evaluation

## 2020-06-15 ENCOUNTER — Other Ambulatory Visit: Payer: Self-pay

## 2020-06-15 LAB — SURGICAL PATHOLOGY

## 2020-06-16 ENCOUNTER — Encounter: Payer: Self-pay | Admitting: Internal Medicine

## 2020-06-18 ENCOUNTER — Telehealth: Payer: Self-pay

## 2020-06-18 NOTE — Telephone Encounter (Signed)
Letter mailed to the pt. 

## 2020-06-18 NOTE — Telephone Encounter (Signed)
Per Dr.Rourk- Send letter to patient.  Send copy of letter with path to referring provider and PCP.  OV with Tana Coast in 4- 6 weeks

## 2020-06-19 ENCOUNTER — Encounter (HOSPITAL_COMMUNITY): Payer: Self-pay | Admitting: Internal Medicine

## 2020-06-19 NOTE — Telephone Encounter (Signed)
OV made 8/30 at 1030 with LSL and appt card

## 2020-06-27 ENCOUNTER — Ambulatory Visit (INDEPENDENT_AMBULATORY_CARE_PROVIDER_SITE_OTHER): Payer: Medicaid Other | Admitting: Nurse Practitioner

## 2020-07-09 ENCOUNTER — Ambulatory Visit: Payer: Medicaid Other | Admitting: Gastroenterology

## 2020-07-09 ENCOUNTER — Encounter: Payer: Self-pay | Admitting: *Deleted

## 2020-07-09 NOTE — Progress Notes (Deleted)
Primary Care Physician: Wilson Singer, MD  Primary Gastroenterologist:  Roetta Sessions, MD   No chief complaint on file.   HPI: Yvette Morales is a 34 y.o. female here for follow-up.  She was seen back in May 2021 for chronic right upper quadrant pain associated with nausea and vomiting.  Symptoms have been occurring for 5 years, she related to starting after a breast surgery for benign disease at which time she had to take hydrocodone postoperatively.  She developed severe hives all over her body in 2 weeks following she started having frequent nausea and vomiting.  Gallbladder work-up was negative including right upper quadrant ultrasound and HIDA scan back in May and July.  She had an EGD earlier this month which endoscopically appeared normal, gastric biopsies were benign with no H. pylori.  Duodenal biopsy was also benign.  Current Outpatient Medications  Medication Sig Dispense Refill  . BIOTIN PO Take 2 tablets by mouth daily.    . Cholecalciferol (VITAMIN D3) 125 MCG (5000 UT) TABS Take 5,000 Units by mouth daily.     . Ensure (ENSURE) Take 237 mLs by mouth in the morning, at noon, in the evening, and at bedtime.    Marland Kitchen ibuprofen (ADVIL) 200 MG tablet Take 400-600 mg by mouth every 6 (six) hours as needed for headache or moderate pain.     . Multiple Vitamin (MULTIVITAMIN) tablet Take 1 tablet by mouth daily.    . nicotine (NICODERM CQ - DOSED IN MG/24 HOURS) 14 mg/24hr patch Place 14 mg onto the skin daily as needed (nicotine dependence).    Marland Kitchen OVER THE COUNTER MEDICATION Take 2-4 drops by mouth daily as needed (appetite/nausea). CBD drops    . pantoprazole (PROTONIX) 40 MG tablet Take 1 tablet (40 mg total) by mouth daily before breakfast. (Patient taking differently: Take 40 mg by mouth daily as needed (acid reflux). ) 30 tablet 5   No current facility-administered medications for this visit.    Allergies as of 07/09/2020 - Review Complete 06/14/2020  Allergen  Reaction Noted  . Codeine Rash and Hives 02/15/2015  . Hydrocodone-acetaminophen Hives 10/08/2016  . Latex Rash and Hives 07/05/2015  . Penicillins Hives and Other (See Comments) 02/15/2015  . Sulfa antibiotics Rash 07/05/2015  . Morphine and related Other (See Comments) 06/11/2018    ROS:  General: Negative for anorexia, weight loss, fever, chills, fatigue, weakness. ENT: Negative for hoarseness, difficulty swallowing , nasal congestion. CV: Negative for chest pain, angina, palpitations, dyspnea on exertion, peripheral edema.  Respiratory: Negative for dyspnea at rest, dyspnea on exertion, cough, sputum, wheezing.  GI: See history of present illness. GU:  Negative for dysuria, hematuria, urinary incontinence, urinary frequency, nocturnal urination.  Endo: Negative for unusual weight change.    Physical Examination:   There were no vitals taken for this visit.  General: Well-nourished, well-developed in no acute distress.  Eyes: No icterus. Mouth: Oropharyngeal mucosa moist and pink , no lesions erythema or exudate. Lungs: Clear to auscultation bilaterally.  Heart: Regular rate and rhythm, no murmurs rubs or gallops.  Abdomen: Bowel sounds are normal, nontender, nondistended, no hepatosplenomegaly or masses, no abdominal bruits or hernia , no rebound or guarding.   Extremities: No lower extremity edema. No clubbing or deformities. Neuro: Alert and oriented x 4   Skin: Warm and dry, no jaundice.   Psych: Alert and cooperative, normal mood and affect.  Labs:  Lab Results  Component Value Date   CREATININE 0.98  03/27/2020   BUN 11 03/27/2020   NA 137 03/27/2020   K 4.8 03/27/2020   CL 105 03/27/2020   CO2 25 03/27/2020   Lab Results  Component Value Date   ALT 12 03/27/2020   AST 17 03/27/2020   ALKPHOS 47 12/05/2018   BILITOT 0.7 03/27/2020   Lab Results  Component Value Date   WBC 6.9 02/21/2020   HGB 13.2 02/21/2020   HCT 41.2 02/21/2020   MCV 89.6 02/21/2020     PLT 271 02/21/2020   Lab Results  Component Value Date   ESRSEDRATE 2 03/27/2020   No results found for: CRP   Imaging Studies: No results found.

## 2020-07-16 ENCOUNTER — Encounter (HOSPITAL_COMMUNITY): Payer: Self-pay | Admitting: Emergency Medicine

## 2020-07-16 ENCOUNTER — Other Ambulatory Visit: Payer: Self-pay

## 2020-07-16 ENCOUNTER — Emergency Department (HOSPITAL_COMMUNITY)
Admission: EM | Admit: 2020-07-16 | Discharge: 2020-07-16 | Disposition: A | Discharge status: Home / Self Care | Payer: Medicaid Other | Attending: Emergency Medicine | Admitting: Emergency Medicine

## 2020-07-16 DIAGNOSIS — F17210 Nicotine dependence, cigarettes, uncomplicated: Secondary | ICD-10-CM | POA: Insufficient documentation

## 2020-07-16 DIAGNOSIS — Z79899 Other long term (current) drug therapy: Secondary | ICD-10-CM | POA: Insufficient documentation

## 2020-07-16 DIAGNOSIS — K0889 Other specified disorders of teeth and supporting structures: Secondary | ICD-10-CM | POA: Insufficient documentation

## 2020-07-16 DIAGNOSIS — Z91040 Latex allergy status: Secondary | ICD-10-CM | POA: Insufficient documentation

## 2020-07-16 MED ORDER — AMOXICILLIN-POT CLAVULANATE 500-125 MG PO TABS
1.0000 | ORAL_TABLET | Freq: Once | ORAL | Status: AC
  Administered 2020-07-16: 500 mg via ORAL
  Filled 2020-07-16: qty 1

## 2020-07-16 MED ORDER — AMOXICILLIN-POT CLAVULANATE 500-125 MG PO TABS
1.0000 | ORAL_TABLET | Freq: Three times a day (TID) | ORAL | 0 refills | Status: AC
Start: 2020-07-16 — End: ?

## 2020-07-16 NOTE — ED Triage Notes (Signed)
Pt here c/o dental pain. Here for the same back in June. Pt hasn't been able to follow up with oral surgeon due to financial problems.

## 2020-07-16 NOTE — ED Provider Notes (Signed)
[facility] EMERGENCY DEPARTMENT Provider Note   CSN: [id number] Arrival date & time: 07/16/20  0141   Time seen 3:50 AM  History Chief Complaint  Patient presents with  . Dental Pain    Yvette Morales is a 34 y.o. female.  HPI   Patient states she has been having pain in her left lower tooth for at least 6 months or longer.  She was [facility] on August [facility] at [facility] and put on clindamycin although she states in the past clindamycin is not helped with her dental pain or infections before.  She states her tooth was hurting about 7 days before the August 31 appointment.  She states they are referring her to an oral surgeon however they will not have an appointment for 2 months.  She has also gone to a dental service office and they will pull her to for $75 so she is going to do that.  She does not have documented fever but states she had chills at home.  She denies any difficulty swallowing or breathing.  She states she also has been having problem with the right lower tooth but it is not hurting as bad at this time.  PCP Wilson Singer, MD   Past Medical History:  Diagnosis Date  . Endometriosis 11/10/2009  . Gastritis     Patient Active Problem List   Diagnosis Date Noted  . RUQ pain 04/03/2020  . Abdominal pain, epigastric 04/03/2020  . N&V (nausea and vomiting) 04/03/2020  . GERD (gastroesophageal reflux disease) 04/03/2020  . Lower abdominal pain 02/21/2020  . Fatigue 02/21/2020    Past Surgical History:  Procedure Laterality Date  . ABDOMINAL HYSTERECTOMY  2011   L Oophrectomy .  Marland Kitchen BIOPSY  06/14/2020   Procedure: BIOPSY;  Surgeon: Corbin Ade, MD;  [facility]: [facility];  Service: Endoscopy;;  . BREAST SURGERY    . ESOPHAGOGASTRODUODENOSCOPY (EGD) WITH PROPOFOL N/A 06/14/2020   Procedure: ESOPHAGOGASTRODUODENOSCOPY (EGD) WITH PROPOFOL;  Surgeon: Corbin Ade, MD;  Location: [facility];  Service: Endoscopy;  Laterality: N/A;  2:45pm  . LAPAROSCOPY        OB History    Gravida  3   Para  2   Term  2   Preterm      AB  1   Living  2     SAB      TAB      Ectopic      Multiple      Live Births              Family History  Problem Relation Age of Onset  . Liver disease Mother        etoh cirrhosis, drank after husbands illness  . Thyroid cancer Father        lymph nodes, had part of intestines removed (colostomy)  . Lupus Maternal Aunt   . Colon cancer Paternal Uncle        younger than age 11  . Colon cancer Cousin        age 35, on dad's side  . Stomach cancer Cousin        mother's side  . Celiac disease Neg Hx   . Inflammatory bowel disease Neg Hx     Social History   Tobacco Use  . Smoking status: Current Every Day Smoker    Types: Cigarettes  . Smokeless tobacco: Former Neurosurgeon  . Tobacco comment: stated she is using a  nicotine patch  Vaping Use  . Vaping Use: Every day  . Substances: CBD, Flavoring, Synthetic cannabinoids  Substance Use Topics  . Alcohol use: Not Currently  . Drug use: Yes    Types: Marijuana    Comment: only when she can get it    Home Medications Prior to Admission medications   Medication Sig Start Date End Date Taking? Authorizing Provider  amoxicillin-clavulanate (AUGMENTIN) 500-125 MG tablet Take 1 tablet (500 mg total) by mouth every 8 (eight) hours. 07/16/20   Devoria Albe, MD  BIOTIN PO Take 2 tablets by mouth daily.    [provider]  Cholecalciferol (VITAMIN D3) 125 MCG (5000 UT) TABS Take 5,000 Units by mouth daily.     [provider]  Ensure (ENSURE) Take 237 mLs by mouth in the morning, at noon, in the evening, and at bedtime.    [provider]  ibuprofen (ADVIL) 200 MG tablet Take 400-600 mg by mouth every 6 (six) hours as needed for headache or moderate pain.     [provider]  Multiple Vitamin (MULTIVITAMIN) tablet Take 1 tablet by mouth daily.    [provider]  nicotine (NICODERM CQ - DOSED IN MG/24 HOURS)  14 mg/24hr patch Place 14 mg onto the skin daily as needed (nicotine dependence).    [provider]  OVER THE COUNTER MEDICATION Take 2-4 drops by mouth daily as needed (appetite/nausea). CBD drops    [provider]  pantoprazole (PROTONIX) 40 MG tablet Take 1 tablet (40 mg total) by mouth daily before breakfast. Patient taking differently: Take 40 mg by mouth daily as needed (acid reflux).  04/03/20   Tiffany Kocher, PA-C    Allergies    Codeine, Hydrocodone-acetaminophen, Latex, Penicillins, Sulfa antibiotics, and Morphine and related  Review of Systems   Review of Systems  All other systems reviewed and are negative.   Physical Exam Updated Vital Signs BP (!) 130/97 (BP Location: Right Arm)   Pulse 87   Temp 98 F (36.7 C) (Oral)   Resp 15   Ht 5\' 5"  (1.651 m)   Wt 46.3 kg   SpO2 100%   BMI 16.97 kg/m   Physical Exam Vitals and nursing note reviewed.  Constitutional:      General: She is not in acute distress.    Appearance: Normal appearance. She is normal weight. She is not ill-appearing or toxic-appearing.  HENT:     Head: Normocephalic and atraumatic.     Comments: No facial swelling seen    Right Ear: External ear normal.     Left Ear: External ear normal.     Nose: Nose normal.     Mouth/Throat:     Mouth: Mucous membranes are moist.      Comments: Patient's voice is normal, she is handling her secretions Neck:     Comments: Patient has bilateral shotty lymphadenopathy Cardiovascular:     Rate and Rhythm: Normal rate and regular rhythm.  Pulmonary:     Effort: Pulmonary effort is normal. No respiratory distress.  Musculoskeletal:        General: Normal range of motion.  Lymphadenopathy:     Cervical: Cervical adenopathy present.  Skin:    General: Skin is warm and dry.  Neurological:     General: No focal deficit present.     Mental Status: She is alert and oriented to person, place, and time.     Cranial Nerves: No cranial nerve  deficit.  Psychiatric:  Mood and Affect: Mood normal.        Behavior: Behavior normal.        Thought Content: Thought content normal.     ED Results / Procedures / Treatments   Labs (all labs ordered are listed, but only abnormal results are displayed) Labs Reviewed - No data to display  EKG None  Radiology No results found.  Procedures Procedures (including critical care time)  Medications Ordered in ED Medications  amoxicillin-clavulanate (AUGMENTIN) 500-125 MG per tablet 500 mg (has no administration in time range)    ED Course  I have reviewed the triage vital signs and the nursing notes.  Pertinent labs & imaging results that were available during my care of the patient were reviewed by me and considered in my medical decision making (see chart for details).    MDM Rules/Calculators/A&P                           Patient states she has been on clindamycin twice and it does not improve her symptoms.  She states the Augmentin she was put on in April worked the best.  Patient is very small, she weighs 102 pounds, she was given Augmentin 500 mg.   Final Clinical Impression(s) / ED Diagnoses Final diagnoses:  Pain, dental    Rx / DC Orders ED Discharge Orders         Ordered    amoxicillin-clavulanate (AUGMENTIN) 500-125 MG tablet  Every 8 hours        07/16/20 0420        OTC ibuprofen and acetaminophen  Plan discharge  Devoria Albe, MD, Concha Pyo, MD 07/16/20 [phone]

## 2020-07-16 NOTE — Discharge Instructions (Addendum)
Take the antibiotic as prescribed.  You can take ibuprofen 400 mg plus acetaminophen 650 mg together every 6 hours as needed for pain.  Use ice packs for comfort.  You will need to follow through with getting a dentist to definitively remove the affected teeth.  Return to the emergency department if you are unable to swallow or if you start having difficulty breathing.

## 2021-10-20 ENCOUNTER — Emergency Department (HOSPITAL_COMMUNITY)
Admission: EM | Admit: 2021-10-20 | Discharge: 2021-10-20 | Disposition: A | Discharge status: Home / Self Care | Payer: Medicaid Other | Attending: Emergency Medicine | Admitting: Emergency Medicine

## 2021-10-20 ENCOUNTER — Other Ambulatory Visit: Payer: Self-pay

## 2021-10-20 DIAGNOSIS — Z28310 Unvaccinated for covid-19: Secondary | ICD-10-CM | POA: Diagnosis not present

## 2021-10-20 DIAGNOSIS — R509 Fever, unspecified: Secondary | ICD-10-CM | POA: Diagnosis present

## 2021-10-20 DIAGNOSIS — Z20822 Contact with and (suspected) exposure to covid-19: Secondary | ICD-10-CM | POA: Diagnosis not present

## 2021-10-20 DIAGNOSIS — Z91040 Latex allergy status: Secondary | ICD-10-CM | POA: Insufficient documentation

## 2021-10-20 DIAGNOSIS — F17210 Nicotine dependence, cigarettes, uncomplicated: Secondary | ICD-10-CM | POA: Insufficient documentation

## 2021-10-20 DIAGNOSIS — J101 Influenza due to other identified influenza virus with other respiratory manifestations: Secondary | ICD-10-CM | POA: Diagnosis not present

## 2021-10-20 DIAGNOSIS — J111 Influenza due to unidentified influenza virus with other respiratory manifestations: Secondary | ICD-10-CM

## 2021-10-20 LAB — RESP PANEL BY RT-PCR (FLU A&B, COVID) ARPGX2
Influenza A by PCR: POSITIVE — AB
Influenza B by PCR: NEGATIVE
SARS Coronavirus 2 by RT PCR: NEGATIVE

## 2021-10-20 NOTE — ED Notes (Signed)
Pt A&Ox4 ambulatory at d/c with independent steady gait, NAD. Pt verbalized understanding of d/c instructions and follow up care. °

## 2021-10-20 NOTE — Discharge Instructions (Signed)
You have the flu, recommend over-the-counter pain medications like ibuprofen Tylenol for fever and pain control, nasal decongestions like Flonase and Zyrtec, Mucinex for cough.  If not eating recommend supplementing with Gatorade to help with electrolyte supplementation.  Follow-up PCP for further evaluation.  Come back to the emergency department if you develop chest pain, shortness of breath, severe abdominal pain, uncontrolled nausea, vomiting, diarrhea.  

## 2021-10-20 NOTE — ED Triage Notes (Signed)
Productive cough, chills, vomiting back pain, x 1 day.

## 2021-10-20 NOTE — ED Provider Notes (Signed)
[facility] EMERGENCY DEPARTMENT Provider Note   CSN: [id number] Arrival date & time: 10/20/21  1922     History Chief Complaint  Patient presents with   Cough   Emesis    Yvette Morales is a 35 y.o. female.  HPI  Patient without significant medical history presents with complaints of URI-like symptoms.  Patient states symptoms started about 3 days ago, initially started with diarrhea then she developed fevers, chills, runny nose, productive cough, scratchy throat, decrease in appetite with some nausea and vomiting.  She states she still able to tolerate p.o., she denies  stomach pain, states that she has been taking over-the-counter pyretics, nasal decongestions which seem to be helping.  She is not vaccine against COVID or the flu, she denies  recent sick contacts, she is not immunocompromise she has no significant respiratory diseases.  She denies  aggravating factors, she has no other complaints this time.  Does not endorse any chest pain shortness of breath pleuritic chest pain or peripheral edema.  Past Medical History:  Diagnosis Date   Endometriosis 11/10/2009   Gastritis     Patient Active Problem List   Diagnosis Date Noted   RUQ pain 04/03/2020   Abdominal pain, epigastric 04/03/2020   N&V (nausea and vomiting) 04/03/2020   GERD (gastroesophageal reflux disease) 04/03/2020   Lower abdominal pain 02/21/2020   Fatigue 02/21/2020    Past Surgical History:  Procedure Laterality Date   ABDOMINAL HYSTERECTOMY  2011   L Oophrectomy .   BIOPSY  06/14/2020   Procedure: BIOPSY;  Surgeon: Corbin Ade, MD;  [facility]: [facility];  Service: Endoscopy;;   BREAST SURGERY     ESOPHAGOGASTRODUODENOSCOPY (EGD) WITH PROPOFOL N/A 06/14/2020   Procedure: ESOPHAGOGASTRODUODENOSCOPY (EGD) WITH PROPOFOL;  Surgeon: Corbin Ade, MD;  Location: [facility];  Service: Endoscopy;  Laterality: N/A;  2:45pm   LAPAROSCOPY       OB History     Gravida  3   Para  2   Term   2   Preterm      AB  1   Living  2      SAB      IAB      Ectopic      Multiple      Live Births              Family History  Problem Relation Age of Onset   Liver disease Mother        etoh cirrhosis, drank after husbands illness   Thyroid cancer Father        lymph nodes, had part of intestines removed (colostomy)   Lupus Maternal Aunt    Colon cancer Paternal Uncle        younger than age 21   Colon cancer Cousin        age 65, on dad's side   Stomach cancer Cousin        mother's side   Celiac disease Neg Hx    Inflammatory bowel disease Neg Hx     Social History   Tobacco Use   Smoking status: Every Day    Types: Cigarettes   Smokeless tobacco: Former   Tobacco comments:    stated she is using a nicotine patch  Vaping Use   Vaping Use: Every day   Substances: CBD, Flavoring, Synthetic cannabinoids  Substance Use Topics   Alcohol use: Not Currently   Drug use: Yes    Types: Marijuana  Comment: only when she can get it    Home Medications Prior to Admission medications   Medication Sig Start Date End Date Taking? Authorizing Provider  amoxicillin-clavulanate (AUGMENTIN) 500-125 MG tablet Take 1 tablet (500 mg total) by mouth every 8 (eight) hours. 07/16/20   Rolland Porter, MD  BIOTIN PO Take 2 tablets by mouth daily.    [provider]  Cholecalciferol (VITAMIN D3) 125 MCG (5000 UT) TABS Take 5,000 Units by mouth daily.     [provider]  Ensure (ENSURE) Take 237 mLs by mouth in the morning, at noon, in the evening, and at bedtime.    [provider]  ibuprofen (ADVIL) 200 MG tablet Take 400-600 mg by mouth every 6 (six) hours as needed for headache or moderate pain.     [provider]  Multiple Vitamin (MULTIVITAMIN) tablet Take 1 tablet by mouth daily.    [provider]  nicotine (NICODERM CQ - DOSED IN MG/24 HOURS) 14 mg/24hr patch Place 14 mg onto the skin daily as needed (nicotine dependence).     [provider]  OVER THE COUNTER MEDICATION Take 2-4 drops by mouth daily as needed (appetite/nausea). CBD drops    [provider]  pantoprazole (PROTONIX) 40 MG tablet Take 1 tablet (40 mg total) by mouth daily before breakfast. Patient taking differently: Take 40 mg by mouth daily as needed (acid reflux).  04/03/20   Mahala Menghini, PA-C    Allergies    Codeine, Hydrocodone-acetaminophen, Latex, Penicillins, Sulfa antibiotics, and Morphine and related  Review of Systems   Review of Systems  Constitutional:  Positive for appetite change, chills and fever.  HENT:  Positive for congestion and sore throat.   Respiratory:  Positive for cough. Negative for shortness of breath.   Cardiovascular:  Negative for chest pain.  Gastrointestinal:  Positive for diarrhea and vomiting. Negative for abdominal pain.  Genitourinary:  Negative for enuresis.  Musculoskeletal:  Positive for myalgias. Negative for back pain.  Skin:  Negative for rash.  Neurological:  Negative for dizziness and headaches.  Hematological:  Does not bruise/bleed easily.   Physical Exam Updated Vital Signs BP 117/72   Pulse 97   Temp 98.8 F (37.1 C) (Oral)   Resp 20   Ht 5\' 5"  (1.651 m)   Wt 49.9 kg   SpO2 98%   BMI 18.30 kg/m   Physical Exam Vitals and nursing note reviewed.  Constitutional:      General: She is not in acute distress.    Appearance: She is not ill-appearing.  HENT:     Head: Normocephalic and atraumatic.     Right Ear: Tympanic membrane, ear canal and external ear normal.     Left Ear: Tympanic membrane, ear canal and external ear normal.     Nose: Congestion present.     Comments: Erythematous turbinates.    Mouth/Throat:     Mouth: Mucous membranes are moist.     Pharynx: Oropharynx is clear. No oropharyngeal exudate or posterior oropharyngeal erythema.  Eyes:     Conjunctiva/sclera: Conjunctivae normal.  Cardiovascular:     Rate and Rhythm: Normal rate and regular  rhythm.     Pulses: Normal pulses.     Heart sounds: No murmur heard.   No friction rub. No gallop.  Pulmonary:     Effort: No respiratory distress.     Breath sounds: No wheezing, rhonchi or rales.  Abdominal:     Palpations: Abdomen is soft.  Tenderness: There is no abdominal tenderness. There is no right CVA tenderness or left CVA tenderness.  Musculoskeletal:     Right lower leg: No edema.     Left lower leg: No edema.  Skin:    General: Skin is warm and dry.  Neurological:     Mental Status: She is alert.  Psychiatric:        Mood and Affect: Mood normal.    ED Results / Procedures / Treatments   Labs (all labs ordered are listed, but only abnormal results are displayed) Labs Reviewed  RESP PANEL BY RT-PCR (FLU A&B, COVID) ARPGX2 - Abnormal; Notable for the following components:      Result Value   Influenza A by PCR POSITIVE (*)    All other components within normal limits    EKG None  Radiology No results found.  Procedures Procedures   Medications Ordered in ED Medications - No data to display  ED Course  I have reviewed the triage vital signs and the nursing notes.  Pertinent labs & imaging results that were available during my care of the patient were reviewed by me and considered in my medical decision making (see chart for details).    MDM Rules/Calculators/A&P                          Initial impression-presents with URI-like symptoms.  Alert, no acute distress, vital signs initially noted for tachycardia.  Likely viral infection, respiratory panel was obtained.  Work-up-respiratory panel positive for influenza A.  Rule out- Low suspicion for systemic infection as patient is nontoxic-appearing, vital signs reassuring, no obvious source infection noted on exam.  Low suspicion for pneumonia as lung sounds are clear bilaterally, will defer imaging at this time.  I have low suspicion for PE as patient denies pleuritic chest pain, shortness of  breath, nontachypneic, nonhypoxic, presentation consistent with influenza. low suspicion for strep throat as oropharynx was visualized, no erythema or exudates noted.  Low suspicion patient would need  hospitalized due to viral infection or Covid as vital signs reassuring, patient is not in respiratory distress.    Plan-  Influenza-recommend symptom management, follow-up with PCP physician do not improve in 1 to 2 weeks, will defer antiviral treatment she is not immunocompromise, has very mild symptoms, low risk for adverse outcome.  Vital signs have remained stable, no indication for hospital admission.   Patient given at home care as well strict return precautions.  Patient verbalized that they understood agreed to said plan.  Final Clinical Impression(s) / ED Diagnoses Final diagnoses:  Influenza    Rx / DC Orders ED Discharge Orders     None        Aron Baba 10/20/21 2154    Hayden Rasmussen, MD 10/21/21 1045

## 2021-10-21 ENCOUNTER — Telehealth: Payer: Self-pay

## 2021-10-21 NOTE — Telephone Encounter (Signed)
Transition Care Management Unsuccessful Follow-up Telephone Call  Date of discharge and from where:  10/20/2021-[place]   Attempts:  1st Attempt  Reason for unsuccessful TCM follow-up call:  Left voice message

## 2021-10-22 NOTE — Telephone Encounter (Signed)
Transition Care Management Unsuccessful Follow-up Telephone Call  Date of discharge and from where:  10/20/2021 from [facility]  Attempts:  2nd Attempt  Reason for unsuccessful TCM follow-up call:  Left voice message

## 2021-10-23 NOTE — Telephone Encounter (Signed)
Transition Care Management Unsuccessful Follow-up Telephone Call  Date of discharge and from where:  10/20/2021 from [facility]  Attempts:  3rd Attempt  Reason for unsuccessful TCM follow-up call:  Unable to reach patient

## 2022-01-21 ENCOUNTER — Encounter: Payer: Self-pay | Admitting: Internal Medicine

## 2022-02-12 ENCOUNTER — Encounter: Payer: Self-pay | Admitting: Internal Medicine

## 2022-04-25 ENCOUNTER — Ambulatory Visit: Payer: Medicaid Other | Admitting: Gastroenterology

## 2022-05-11 NOTE — Progress Notes (Deleted)
Referring Provider: Beatrix Fetters, MD Primary Care Physician:  Beatrix Fetters, MD Primary GI Physician: Dr. Jena Gauss  No chief complaint on file.   HPI:   Yvette Morales is a 36 y.o. female presenting today at the request of Kotturi, Zadie Rhine, MD for abdominal pain.  We last saw patient in May 2021 for GERD, nausea/vomiting, epigastric and RUQ abdominal pain.  She reported symptoms x5 years at that point.  Started 5 years ago after breast surgery for benign disease.  Stated she took hydrocodone postoperatively, developed hives, then 2 weeks later started having GI issues.  Reported frequent vomiting, diarrhea associated with RUQ pain/right flank pain.  Reported poor appetite, afraid to eat or drink as even water would make her vomit.  Also with frequent regurgitation of acid material, belching.  Stools could be frequent especially after certain foods.  Reported Mickley stools at times, but thought it was associated with Carafate.  She was started on Protonix 40 mg daily, advised to limit NSAIDs, scheduled for ultrasound.  If ultrasound unremarkable, would arrange HIDA, and then proceed with EGD if gallbladder work-up unremarkable.  RUQ ultrasound was negative.  HIDA 04/17/2020 with normal exam.  EGD 06/14/2020 for N/V and RUQ abdominal pain with normal exam s/p gastric and duodenal biopsy (benign).   Reviewed recent labs completed February 2023.  LFTs normal.  CBC unremarkable.  TSH normal.  Sed rate normal.  Today:     Prior CT renal study in January 2020, lower abdominal pain with no acute findings.  Past Medical History:  Diagnosis Date   Endometriosis 11/10/2009   Gastritis     Past Surgical History:  Procedure Laterality Date   ABDOMINAL HYSTERECTOMY  2011   L Oophrectomy .   BIOPSY  06/14/2020   Procedure: BIOPSY;  Surgeon: Corbin Ade, MD;  Location: [facility];  Service: Endoscopy;;   BREAST SURGERY     ESOPHAGOGASTRODUODENOSCOPY (EGD) WITH PROPOFOL N/A  06/14/2020   Procedure: ESOPHAGOGASTRODUODENOSCOPY (EGD) WITH PROPOFOL;  Surgeon: Corbin Ade, MD;  Location: [facility];  Service: Endoscopy;  Laterality: N/A;  2:45pm   LAPAROSCOPY      Current Outpatient Medications  Medication Sig Dispense Refill   amoxicillin-clavulanate (AUGMENTIN) 500-125 MG tablet Take 1 tablet (500 mg total) by mouth every 8 (eight) hours. 21 tablet 0   BIOTIN PO Take 2 tablets by mouth daily.     Cholecalciferol (VITAMIN D3) 125 MCG (5000 UT) TABS Take 5,000 Units by mouth daily.      Ensure (ENSURE) Take 237 mLs by mouth in the morning, at noon, in the evening, and at bedtime.     ibuprofen (ADVIL) 200 MG tablet Take 400-600 mg by mouth every 6 (six) hours as needed for headache or moderate pain.      Multiple Vitamin (MULTIVITAMIN) tablet Take 1 tablet by mouth daily.     nicotine (NICODERM CQ - DOSED IN MG/24 HOURS) 14 mg/24hr patch Place 14 mg onto the skin daily as needed (nicotine dependence).     OVER THE COUNTER MEDICATION Take 2-4 drops by mouth daily as needed (appetite/nausea). CBD drops     pantoprazole (PROTONIX) 40 MG tablet Take 1 tablet (40 mg total) by mouth daily before breakfast. (Patient taking differently: Take 40 mg by mouth daily as needed (acid reflux). ) 30 tablet 5   No current facility-administered medications for this visit.    Allergies as of 05/12/2022 - Review Complete 10/20/2021  Allergen Reaction Noted  Codeine Rash and Hives 02/15/2015   Hydrocodone-acetaminophen Hives 10/08/2016   Latex Rash and Hives 07/05/2015   Penicillins Hives and Other (See Comments) 02/15/2015   Sulfa antibiotics Rash 07/05/2015   Morphine and related Other (See Comments) 06/11/2018    Family History  Problem Relation Age of Onset   Liver disease Mother        etoh cirrhosis, drank after husbands illness   Thyroid cancer Father        lymph nodes, had part of intestines removed (colostomy)   Lupus Maternal Aunt    Colon cancer Paternal  Uncle        younger than age 33   Colon cancer Cousin        age 34, on dad's side   Stomach cancer Cousin        mother's side   Celiac disease Neg Hx    Inflammatory bowel disease Neg Hx     Social History   Socioeconomic History   Marital status: Married    Spouse name: Not on file   Number of children: Not on file   Years of education: Not on file   Highest education level: Not on file  Occupational History   Occupation: unemployed  Tobacco Use   Smoking status: Every Day    Types: Cigarettes   Smokeless tobacco: Former   Tobacco comments:    stated she is using a nicotine patch  Vaping Use   Vaping Use: Every day   Substances: CBD, Flavoring, Synthetic cannabinoids  Substance and Sexual Activity   Alcohol use: Not Currently   Drug use: Yes    Types: Marijuana    Comment: only when she can get it   Sexual activity: Yes  Other Topics Concern   Not on file  Social History Narrative   Married for 7 years.Lives with husband and 2 daughters and 1 grandaughter.   Social Determinants of Health   Financial Resource Strain: Low Risk  (04/17/2020)   Overall Financial Resource Strain (CARDIA)    Difficulty of Paying Living Expenses: Not hard at all  Food Insecurity: No Food Insecurity (04/17/2020)   Hunger Vital Sign    Worried About Running Out of Food in the Last Year: Never true    Ran Out of Food in the Last Year: Never true  Transportation Needs: No Transportation Needs (04/17/2020)   PRAPARE - Administrator, Civil Service (Medical): No    Lack of Transportation (Non-Medical): No  Physical Activity: Insufficiently Active (04/17/2020)   Exercise Vital Sign    Days of Exercise per Week: 3 days    Minutes of Exercise per Session: 30 min  Stress: Stress Concern Present (04/17/2020)   [employer] of Occupational Health - Occupational Stress Questionnaire    Feeling of Stress : To some extent  Social Connections: Moderately Isolated (04/17/2020)   Social  Connection and Isolation Panel [NHANES]    Frequency of Communication with Friends and Family: Three times a week    Frequency of Social Gatherings with Friends and Family: Once a week    Attends Religious Services: Never    Database administrator or Organizations: No    Attends Engineer, structural: Never    Marital Status: Married    Review of Systems: Gen: Denies fever, chills, cold or flulike symptoms, presyncope, syncope. CV: Denies chest pain, palpitations. Resp: Denies dyspnea at rest, cough. GI: See HPI Heme: See HPI  Physical Exam: There were no vitals  taken for this visit. General:   Alert and oriented. No distress noted. Pleasant and cooperative.  Head:  Normocephalic and atraumatic. Eyes:  Conjuctiva clear without scleral icterus. Heart:  S1, S2 present without murmurs appreciated. Lungs:  Clear to auscultation bilaterally. No wheezes, rales, or rhonchi. No distress.  Abdomen:  +BS, soft, non-tender and non-distended. No rebound or guarding. No HSM or masses noted. Msk:  Symmetrical without gross deformities. Normal posture. Extremities:  Without edema. Neurologic:  Alert and  oriented x4 Psych:  Normal mood and affect.    Assessment:     Plan:  ***   Ermalinda Memos, PA-C [facility] 05/12/2022

## 2022-05-12 ENCOUNTER — Ambulatory Visit: Payer: Medicaid Other | Admitting: Gastroenterology

## 2022-05-12 ENCOUNTER — Encounter: Payer: Self-pay | Admitting: Internal Medicine

## 2022-06-25 ENCOUNTER — Emergency Department (HOSPITAL_COMMUNITY)
Admission: EM | Admit: 2022-06-25 | Discharge: 2022-06-25 | Disposition: A | Discharge status: Other Acute Inpatient Hospital | Payer: Medicaid Other

## 2022-10-03 DIAGNOSIS — F4310 Post-traumatic stress disorder, unspecified: Secondary | ICD-10-CM | POA: Diagnosis not present

## 2022-10-09 DIAGNOSIS — F4310 Post-traumatic stress disorder, unspecified: Secondary | ICD-10-CM | POA: Diagnosis not present

## 2022-10-17 DIAGNOSIS — F4310 Post-traumatic stress disorder, unspecified: Secondary | ICD-10-CM | POA: Diagnosis not present

## 2022-10-30 DIAGNOSIS — F4310 Post-traumatic stress disorder, unspecified: Secondary | ICD-10-CM | POA: Diagnosis not present

## 2022-11-06 DIAGNOSIS — F4310 Post-traumatic stress disorder, unspecified: Secondary | ICD-10-CM | POA: Diagnosis not present

## 2022-11-27 DIAGNOSIS — F4310 Post-traumatic stress disorder, unspecified: Secondary | ICD-10-CM | POA: Diagnosis not present

## 2022-12-04 DIAGNOSIS — F4310 Post-traumatic stress disorder, unspecified: Secondary | ICD-10-CM | POA: Diagnosis not present

## 2022-12-11 DIAGNOSIS — F4310 Post-traumatic stress disorder, unspecified: Secondary | ICD-10-CM | POA: Diagnosis not present

## 2022-12-18 DIAGNOSIS — F4310 Post-traumatic stress disorder, unspecified: Secondary | ICD-10-CM | POA: Diagnosis not present

## 2023-11-22 ENCOUNTER — Emergency Department (HOSPITAL_COMMUNITY)
Admission: EM | Admit: 2023-11-22 | Discharge: 2023-11-22 | Disposition: A | Discharge status: Home / Self Care | Payer: 59 | Attending: Emergency Medicine | Admitting: Emergency Medicine

## 2023-11-22 ENCOUNTER — Encounter (HOSPITAL_COMMUNITY): Payer: Self-pay

## 2023-11-22 ENCOUNTER — Other Ambulatory Visit: Payer: Self-pay

## 2023-11-22 DIAGNOSIS — Z91040 Latex allergy status: Secondary | ICD-10-CM | POA: Diagnosis not present

## 2023-11-22 DIAGNOSIS — R109 Unspecified abdominal pain: Secondary | ICD-10-CM | POA: Diagnosis present

## 2023-11-22 DIAGNOSIS — R1011 Right upper quadrant pain: Secondary | ICD-10-CM | POA: Diagnosis not present

## 2023-11-22 DIAGNOSIS — R1012 Left upper quadrant pain: Secondary | ICD-10-CM | POA: Insufficient documentation

## 2023-11-22 DIAGNOSIS — R1013 Epigastric pain: Secondary | ICD-10-CM | POA: Insufficient documentation

## 2023-11-22 LAB — URINALYSIS, ROUTINE W REFLEX MICROSCOPIC
Bacteria, UA: NONE SEEN
Bilirubin Urine: NEGATIVE
Glucose, UA: NEGATIVE mg/dL
Hgb urine dipstick: NEGATIVE
Ketones, ur: 5 mg/dL — AB
Nitrite: NEGATIVE
Protein, ur: >=300 mg/dL — AB
Specific Gravity, Urine: 1.03 (ref 1.005–1.030)
pH: 5 (ref 5.0–8.0)

## 2023-11-22 LAB — CBC
HCT: 46.2 % — ABNORMAL HIGH (ref 36.0–46.0)
Hemoglobin: 14.8 g/dL (ref 12.0–15.0)
MCH: 28.6 pg (ref 26.0–34.0)
MCHC: 32 g/dL (ref 30.0–36.0)
MCV: 89.2 fL (ref 80.0–100.0)
Platelets: 202 10*3/uL (ref 150–400)
RBC: 5.18 MIL/uL — ABNORMAL HIGH (ref 3.87–5.11)
RDW: 13.9 % (ref 11.5–15.5)
WBC: 7.1 10*3/uL (ref 4.0–10.5)
nRBC: 0 % (ref 0.0–0.2)

## 2023-11-22 LAB — COMPREHENSIVE METABOLIC PANEL
ALT: 12 U/L (ref 0–44)
AST: 14 U/L — ABNORMAL LOW (ref 15–41)
Albumin: 4.7 g/dL (ref 3.5–5.0)
Alkaline Phosphatase: 45 U/L (ref 38–126)
Anion gap: 8 (ref 5–15)
BUN: 23 mg/dL — ABNORMAL HIGH (ref 6–20)
CO2: 20 mmol/L — ABNORMAL LOW (ref 22–32)
Calcium: 9.6 mg/dL (ref 8.9–10.3)
Chloride: 105 mmol/L (ref 98–111)
Creatinine, Ser: 0.9 mg/dL (ref 0.44–1.00)
GFR, Estimated: >60 mL/min (ref >60)
Glucose, Bld: 82 mg/dL (ref 70–99)
Potassium: 4.4 mmol/L (ref 3.5–5.1)
Sodium: 133 mmol/L — ABNORMAL LOW (ref 135–145)
Total Bilirubin: 0.6 mg/dL (ref 0.0–1.2)
Total Protein: 7.9 g/dL (ref 6.5–8.1)

## 2023-11-22 LAB — LIPASE, BLOOD: Lipase: 26 U/L (ref 11–51)

## 2023-11-22 MED ORDER — PANTOPRAZOLE SODIUM 40 MG PO TBEC: 40.0000 mg | DELAYED_RELEASE_TABLET | Freq: Every day | ORAL | 5 refills | Status: AC

## 2023-11-22 NOTE — ED Triage Notes (Signed)
 Pt c/o generalized abdominal pain starting the past two days. Pt states "it's the don't feel good pain." Pt states she attempted to go to work and when trying to lift states mid upper abdominal pain that radiates to her sides and into her back.

## 2023-11-22 NOTE — Discharge Instructions (Signed)
 Your blood tests and urine test were all unremarkable, no signs of surgical causes of your pain, I have prescribed 2 medications, Protonix  daily, make sure you take it every single day for at least 30 days  Please take Naprosyn , 500mg  by mouth twice daily as needed for pain - this in an antiinflammatory medicine (NSAID) and is similar to ibuprofen - many people feel that it is stronger than ibuprofen and it is easier to take since it is a smaller pill.  Please use this only for 1 week - if your pain persists, you will need to follow up with your doctor in the office for ongoing guidance and pain control.  Thank you for allowing us  to treat you in the emergency department today.  After reviewing your examination and potential testing that was done it appears that you are safe to go home.  I would like for you to follow-up with your doctor within the next several days, have them obtain your records and follow-up with them to review all potential tests and results from your visit.  If you should develop severe or worsening symptoms return to the emergency department immediately

## 2023-11-22 NOTE — ED Provider Notes (Signed)
 [place] EMERGENCY DEPARTMENT AT [facility] Provider Note   CSN: [id number] Arrival date & time: 11/22/23  1816     History  Chief Complaint  Patient presents with   Abdominal Pain    Yvette Morales is a 38 y.o. female.   Abdominal Pain 38 year old female Stray of a hysterectomy, presents to the hospital today with a complaint of pain in her abdomen which started yesterday, she has had increasing pain when she tries to lift things, she is not having any vomiting or diarrhea in fact she does not have frequent bowel movements, she did have 1 last night.  No blood in the stool, no urinary symptoms other than frequent small-volume urination.  No fevers or chills no nausea or vomiting no prior cholecystectomy or appendectomy, no history of kidney stones.      Home Medications Prior to Admission medications   Medication Sig Start Date End Date Taking? Authorizing Provider  amoxicillin -clavulanate (AUGMENTIN ) 500-125 MG tablet Take 1 tablet (500 mg total) by mouth every 8 (eight) hours. 07/16/20   Knapp, Iva, MD  BIOTIN PO Take 2 tablets by mouth daily.    [provider]  Cholecalciferol (VITAMIN D3) 125 MCG (5000 UT) TABS Take 5,000 Units by mouth daily.     [provider]  Ensure (ENSURE) Take 237 mLs by mouth in the morning, at noon, in the evening, and at bedtime.    [provider]  ibuprofen (ADVIL) 200 MG tablet Take 400-600 mg by mouth every 6 (six) hours as needed for headache or moderate pain.     [provider]  Multiple Vitamin (MULTIVITAMIN) tablet Take 1 tablet by mouth daily.    [provider]  nicotine (NICODERM CQ - DOSED IN MG/24 HOURS) 14 mg/24hr patch Place 14 mg onto the skin daily as needed (nicotine dependence).    [provider]  OVER THE COUNTER MEDICATION Take 2-4 drops by mouth daily as needed (appetite/nausea). CBD drops    [provider]  pantoprazole  (PROTONIX ) 40 MG tablet Take 1  tablet (40 mg total) by mouth daily before breakfast. 11/22/23   Cleotilde Rogue, MD      Allergies    Codeine, Hydrocodone-acetaminophen, Latex, Penicillins, Sulfa  antibiotics, and Morphine and codeine    Review of Systems   Review of Systems  Gastrointestinal:  Positive for abdominal pain.  All other systems reviewed and are negative.   Physical Exam Updated Vital Signs BP 129/88 (BP Location: Right Arm)   Pulse 93   Temp 98 F (36.7 C) (Oral)   Resp 16   Ht 1.651 m (5' 5)   Wt 49.9 kg   SpO2 100%   BMI 18.31 kg/m  Physical Exam Vitals and nursing note reviewed.  Constitutional:      General: She is not in acute distress.    Appearance: She is well-developed.  HENT:     Head: Normocephalic and atraumatic.     Mouth/Throat:     Pharynx: No oropharyngeal exudate.  Eyes:     General: No scleral icterus.       Right eye: No discharge.        Left eye: No discharge.     Conjunctiva/sclera: Conjunctivae normal.     Pupils: Pupils are equal, round, and reactive to light.  Neck:     Thyroid : No thyromegaly.     Vascular: No JVD.  Cardiovascular:     Rate and Rhythm: Normal rate and regular rhythm.  Heart sounds: Normal heart sounds. No murmur heard.    No friction rub. No gallop.  Pulmonary:     Effort: Pulmonary effort is normal. No respiratory distress.     Breath sounds: Normal breath sounds. No wheezing or rales.  Abdominal:     General: Bowel sounds are normal. There is no distension.     Palpations: Abdomen is soft. There is no mass.     Tenderness: There is abdominal tenderness.     Comments: Tender to palpation right upper and left upper quadrant and mild CVA tenderness bilaterally, absolutely no lower abdominal tenderness, no guarding or peritoneal signs.  Musculoskeletal:        General: No tenderness. Normal range of motion.     Cervical back: Normal range of motion and neck supple.     Right lower leg: No edema.     Left lower leg: No edema.   Lymphadenopathy:     Cervical: No cervical adenopathy.  Skin:    General: Skin is warm and dry.     Findings: No erythema or rash.  Neurological:     Mental Status: She is alert.     Coordination: Coordination normal.  Psychiatric:        Behavior: Behavior normal.     ED Results / Procedures / Treatments   Labs (all labs ordered are listed, but only abnormal results are displayed) Labs Reviewed  COMPREHENSIVE METABOLIC PANEL - Abnormal; Notable for the following components:      Result Value   Sodium 133 (*)    CO2 20 (*)    BUN 23 (*)    AST 14 (*)    All other components within normal limits  CBC - Abnormal; Notable for the following components:   RBC 5.18 (*)    HCT 46.2 (*)    All other components within normal limits  URINALYSIS, ROUTINE W REFLEX MICROSCOPIC - Abnormal; Notable for the following components:   Color, Urine AMBER (*)    APPearance HAZY (*)    Ketones, ur 5 (*)    Protein, ur >=300 (*)    Leukocytes,Ua TRACE (*)    All other components within normal limits  LIPASE, BLOOD    EKG None  Radiology No results found.  Procedures Procedures    Medications Ordered in ED Medications - No data to display  ED Course/ Medical Decision Making/ A&P                                 Medical Decision Making Amount and/or Complexity of Data Reviewed Labs: ordered.   Patient is well-appearing, urinalysis reveals that she has some ketones but no signs of nitrates or significant leukocytes, doubt UTI, check labs to make sure there is nothing else going on but vital signs are unremarkable, she does have a positive Carnett's sign to suggest that this might be muscular wall, the patient is agreeable to the workup  Labs are unremarkable, CBC metabolic panel and lipase, urine clean, vital signs unremarkable, patient will be started on Protonix  daily, she has not been taking it at all very regularly, also recommended anti-inflammatory for the muscular  component.  Stable for discharge, nonsurgical abdomen  I have discussed with the patient at the bedside the results, and the meaning of these results.  They have had opportunity to ask questions,  expressed their understanding to the need for follow-up with primary care physician  Final Clinical Impression(s) / ED Diagnoses Final diagnoses:  Epigastric pain    Rx / DC Orders ED Discharge Orders          Ordered    pantoprazole  (PROTONIX ) 40 MG tablet  Daily before breakfast        11/22/23 2051              Cleotilde Rogue, MD 11/22/23 2051

## 2024-07-07 ENCOUNTER — Other Ambulatory Visit: Payer: Self-pay

## 2024-07-07 ENCOUNTER — Emergency Department (HOSPITAL_BASED_OUTPATIENT_CLINIC_OR_DEPARTMENT_OTHER)
Admission: EM | Admit: 2024-07-07 | Discharge: 2024-07-07 | Disposition: A | Discharge status: Home / Self Care | Attending: Emergency Medicine | Admitting: Emergency Medicine

## 2024-07-07 ENCOUNTER — Encounter (HOSPITAL_BASED_OUTPATIENT_CLINIC_OR_DEPARTMENT_OTHER): Payer: Self-pay

## 2024-07-07 DIAGNOSIS — Z91040 Latex allergy status: Secondary | ICD-10-CM | POA: Diagnosis not present

## 2024-07-07 DIAGNOSIS — L02412 Cutaneous abscess of left axilla: Secondary | ICD-10-CM | POA: Diagnosis present

## 2024-07-07 DIAGNOSIS — L0291 Cutaneous abscess, unspecified: Secondary | ICD-10-CM

## 2024-07-07 MED ORDER — LIDOCAINE HCL (PF) 1 % IJ SOLN
5.0000 mL | Freq: Once | INTRAMUSCULAR | Status: AC
  Administered 2024-07-07: 5 mL
  Filled 2024-07-07: qty 5

## 2024-07-07 MED ORDER — DOXYCYCLINE HYCLATE 100 MG PO CAPS: 100.0000 mg | ORAL_CAPSULE | Freq: Two times a day (BID) | ORAL | 0 refills | Status: AC

## 2024-07-07 NOTE — ED Provider Notes (Signed)
 [place] EMERGENCY DEPARTMENT AT [facility] Provider Note   CSN: [id number] Arrival date & time: 07/07/24  0630     Patient presents with: Abscess   Yvette Morales is a 38 y.o. female.   Patient here with abscess to her left axilla.  History of the same.  Occurred after shaving which has occurred in the past.  She denies any fever or chills.  She has been using warm compresses without much help.  No major medical problems.  No diabetes history.  The history is provided by the patient.       Prior to Admission medications   Medication Sig Start Date End Date Taking? Authorizing Provider  doxycycline  (VIBRAMYCIN ) 100 MG capsule Take 1 capsule (100 mg total) by mouth 2 (two) times daily. 07/07/24  Yes Thea Holshouser, DO  amoxicillin -clavulanate (AUGMENTIN ) 500-125 MG tablet Take 1 tablet (500 mg total) by mouth every 8 (eight) hours. 07/16/20   Knapp, Iva, MD  BIOTIN PO Take 2 tablets by mouth daily.    [provider]  Cholecalciferol (VITAMIN D3) 125 MCG (5000 UT) TABS Take 5,000 Units by mouth daily.     [provider]  Ensure (ENSURE) Take 237 mLs by mouth in the morning, at noon, in the evening, and at bedtime.    [provider]  ibuprofen (ADVIL) 200 MG tablet Take 400-600 mg by mouth every 6 (six) hours as needed for headache or moderate pain.     [provider]  Multiple Vitamin (MULTIVITAMIN) tablet Take 1 tablet by mouth daily.    [provider]  nicotine (NICODERM CQ - DOSED IN MG/24 HOURS) 14 mg/24hr patch Place 14 mg onto the skin daily as needed (nicotine dependence).    [provider]  OVER THE COUNTER MEDICATION Take 2-4 drops by mouth daily as needed (appetite/nausea). CBD drops    [provider]  pantoprazole  (PROTONIX ) 40 MG tablet Take 1 tablet (40 mg total) by mouth daily before breakfast. 11/22/23   Cleotilde Rogue, MD    Allergies: Codeine, Hydrocodone-acetaminophen, Latex, Penicillins, Sulfa   antibiotics, and Morphine and codeine    Review of Systems  Updated Vital Signs BP 133/62   Pulse 75   Temp 98.6 F (37 C) (Oral)   Resp 16   SpO2 100%   Physical Exam Constitutional:      General: She is not in acute distress.    Appearance: She is not ill-appearing.  Cardiovascular:     Pulses: Normal pulses.  Pulmonary:     Effort: Pulmonary effort is normal.  Skin:    Capillary Refill: Capillary refill takes less than 2 seconds.     Comments: Dime size abscess in the left axilla with mild fluctuance no surrounding erythema  Neurological:     Mental Status: She is alert.     Sensory: No sensory deficit.     Motor: No weakness.     (all labs ordered are listed, but only abnormal results are displayed) Labs Reviewed - No data to display  EKG: None  Radiology: No results found.   .Incision and Drainage  Date/Time: 07/07/2024 7:25 AM  Performed by: Ruthe Cornet, DO Authorized by: Ruthe Cornet, DO   Consent:    Consent obtained:  Verbal   Consent given by:  Patient   Risks, benefits, and alternatives were discussed: yes     Risks discussed:  Bleeding, damage to other organs, infection, pain and incomplete drainage   Alternatives discussed:  No treatment Universal protocol:  Procedure explained and questions answered to patient or proxy's satisfaction: yes     Patient identity confirmed:  Verbally with patient Location:    Type:  Abscess   Size:  2x2 cm   Location:  Upper extremity   Upper extremity location: Left axilla. Pre-procedure details:    Skin preparation:  Chlorhexidine  with alcohol Sedation:    Sedation type:  None Anesthesia:    Anesthesia method:  Local infiltration   Local anesthetic:  Lidocaine  1% w/o epi Procedure type:    Complexity:  Simple Procedure details:    Incision types:  Stab incision   Incision depth:  Dermal   Drainage:  Purulent   Drainage amount:  Moderate   Wound treatment:  Wound left open   Packing  materials:  None Post-procedure details:    Procedure completion:  Tolerated    Medications Ordered in the ED  lidocaine  (PF) (XYLOCAINE ) 1 % injection 5 mL (5 mLs Other Given 07/07/24 0706)                                    Medical Decision Making Risk Prescription drug management.   Yvette Morales is here with abscess to the left axilla.  Normal vitals.  No fever.  Fairly localized 2 x 2 cm abscess to the left axilla that was I&D at the bedside with lidocaine  and single stab incision.  Fair amount of purulent discharge was expressed.  Wound was cleaned washed and will start antibiotics.  Recommend continue warm compresses at home.  Understand return precautions.  Discharge.  Neurovascular neuromuscular intact.  Have no concern for systemic infection.  This chart was dictated using voice recognition software.  Despite best efforts to proofread,  errors can occur which can change the documentation meaning.      Final diagnoses:  Abscess    ED Discharge Orders          Ordered    doxycycline  (VIBRAMYCIN ) 100 MG capsule  2 times daily        07/07/24 0724               Ruthe Cornet, DO 07/07/24 [phone]

## 2024-07-07 NOTE — ED Triage Notes (Signed)
 Pt presents via POV c/o abscess under left armpit.
